# Patient Record
Sex: Female | Born: 1970 | Race: White | Hispanic: No | Marital: Single | Smoking: Current every day smoker
Health system: Southern US, Community
[De-identification: ages and names within clinical notes are randomized; demographics above are authoritative.]

## PROBLEM LIST (undated history)

## (undated) DIAGNOSIS — K219 Gastro-esophageal reflux disease without esophagitis: Secondary | ICD-10-CM

## (undated) DIAGNOSIS — N83209 Unspecified ovarian cyst, unspecified side: Secondary | ICD-10-CM

## (undated) DIAGNOSIS — C801 Malignant (primary) neoplasm, unspecified: Secondary | ICD-10-CM

## (undated) DIAGNOSIS — R51 Headache: Secondary | ICD-10-CM

## (undated) DIAGNOSIS — D649 Anemia, unspecified: Secondary | ICD-10-CM

## (undated) DIAGNOSIS — R0602 Shortness of breath: Secondary | ICD-10-CM

## (undated) DIAGNOSIS — F419 Anxiety disorder, unspecified: Secondary | ICD-10-CM

## (undated) DIAGNOSIS — I499 Cardiac arrhythmia, unspecified: Secondary | ICD-10-CM

## (undated) DIAGNOSIS — M1990 Unspecified osteoarthritis, unspecified site: Secondary | ICD-10-CM

## (undated) DIAGNOSIS — Z720 Tobacco use: Secondary | ICD-10-CM

## (undated) DIAGNOSIS — E079 Disorder of thyroid, unspecified: Secondary | ICD-10-CM

## (undated) DIAGNOSIS — I10 Essential (primary) hypertension: Secondary | ICD-10-CM

## (undated) DIAGNOSIS — N879 Dysplasia of cervix uteri, unspecified: Secondary | ICD-10-CM

## (undated) HISTORY — PX: TOTAL ABDOMINAL HYSTERECTOMY: SHX209

## (undated) HISTORY — DX: Tobacco use: Z72.0

## (undated) HISTORY — PX: ABDOMINAL HYSTERECTOMY: SHX81

## (undated) HISTORY — DX: Unspecified ovarian cyst, unspecified side: N83.209

## (undated) HISTORY — DX: Dysplasia of cervix uteri, unspecified: N87.9

## (undated) HISTORY — PX: UPPER GASTROINTESTINAL ENDOSCOPY: SHX188

## (undated) HISTORY — PX: TONSILLECTOMY: SUR1361

## (undated) HISTORY — PX: OOPHORECTOMY: SHX86

## (undated) HISTORY — DX: Essential (primary) hypertension: I10

---

## 2012-11-10 ENCOUNTER — Ambulatory Visit (INDEPENDENT_AMBULATORY_CARE_PROVIDER_SITE_OTHER): Payer: BC Managed Care – PPO

## 2012-11-10 ENCOUNTER — Encounter: Payer: Self-pay | Admitting: Physician Assistant

## 2012-11-10 ENCOUNTER — Ambulatory Visit (INDEPENDENT_AMBULATORY_CARE_PROVIDER_SITE_OTHER): Payer: BC Managed Care – PPO | Admitting: Physician Assistant

## 2012-11-10 VITALS — BP 116/79 | HR 83 | Wt 164.0 lb

## 2012-11-10 DIAGNOSIS — R200 Anesthesia of skin: Secondary | ICD-10-CM

## 2012-11-10 DIAGNOSIS — M5380 Other specified dorsopathies, site unspecified: Secondary | ICD-10-CM

## 2012-11-10 DIAGNOSIS — R202 Paresthesia of skin: Secondary | ICD-10-CM

## 2012-11-10 DIAGNOSIS — Z13220 Encounter for screening for lipoid disorders: Secondary | ICD-10-CM

## 2012-11-10 DIAGNOSIS — R7989 Other specified abnormal findings of blood chemistry: Secondary | ICD-10-CM

## 2012-11-10 DIAGNOSIS — M79609 Pain in unspecified limb: Secondary | ICD-10-CM

## 2012-11-10 DIAGNOSIS — Z131 Encounter for screening for diabetes mellitus: Secondary | ICD-10-CM

## 2012-11-10 DIAGNOSIS — M4800 Spinal stenosis, site unspecified: Secondary | ICD-10-CM

## 2012-11-10 DIAGNOSIS — M79602 Pain in left arm: Secondary | ICD-10-CM

## 2012-11-10 DIAGNOSIS — Z23 Encounter for immunization: Secondary | ICD-10-CM

## 2012-11-10 DIAGNOSIS — R946 Abnormal results of thyroid function studies: Secondary | ICD-10-CM

## 2012-11-10 DIAGNOSIS — R209 Unspecified disturbances of skin sensation: Secondary | ICD-10-CM

## 2012-11-10 DIAGNOSIS — Z1239 Encounter for other screening for malignant neoplasm of breast: Secondary | ICD-10-CM

## 2012-11-10 DIAGNOSIS — M25519 Pain in unspecified shoulder: Secondary | ICD-10-CM

## 2012-11-10 DIAGNOSIS — M5030 Other cervical disc degeneration, unspecified cervical region: Secondary | ICD-10-CM

## 2012-11-10 MED ORDER — VARENICLINE TARTRATE 0.5 MG X 11 & 1 MG X 42 PO MISC: ORAL | Status: DC

## 2012-11-10 MED ORDER — PREDNISONE 50 MG PO TABS: ORAL_TABLET | ORAL | Status: DC

## 2012-11-10 MED ORDER — OMEPRAZOLE 40 MG PO CPDR: 40.0000 mg | DELAYED_RELEASE_CAPSULE | Freq: Every day | ORAL | Status: DC

## 2012-11-10 MED ORDER — GABAPENTIN 100 MG PO CAPS: 100.0000 mg | ORAL_CAPSULE | Freq: Three times a day (TID) | ORAL | Status: DC

## 2012-11-11 LAB — COMPLETE METABOLIC PANEL WITH GFR
ALT: 13 U/L (ref 0–35)
AST: 17 U/L (ref 0–37)
Alkaline Phosphatase: 65 U/L (ref 39–117)
Calcium: 9.4 mg/dL (ref 8.4–10.5)
Chloride: 104 mEq/L (ref 96–112)
Creat: 0.45 mg/dL — ABNORMAL LOW (ref 0.50–1.10)
Total Bilirubin: 0.4 mg/dL (ref 0.3–1.2)

## 2012-11-11 LAB — LIPID PANEL
LDL Cholesterol: 100 mg/dL — ABNORMAL HIGH (ref 0–99)
Total CHOL/HDL Ratio: 3.4 Ratio
Triglycerides: 103 mg/dL (ref <150)
VLDL: 21 mg/dL (ref 0–40)

## 2012-11-11 LAB — T4, FREE: Free T4: 0.81 ng/dL (ref 0.80–1.80)

## 2012-11-12 ENCOUNTER — Telehealth: Payer: Self-pay | Admitting: *Deleted

## 2012-11-12 ENCOUNTER — Other Ambulatory Visit: Payer: Self-pay | Admitting: Physician Assistant

## 2012-11-12 ENCOUNTER — Encounter: Payer: Self-pay | Admitting: Physician Assistant

## 2012-11-12 DIAGNOSIS — M4802 Spinal stenosis, cervical region: Secondary | ICD-10-CM

## 2012-11-12 DIAGNOSIS — M5030 Other cervical disc degeneration, unspecified cervical region: Secondary | ICD-10-CM | POA: Insufficient documentation

## 2012-11-12 NOTE — Telephone Encounter (Signed)
Pt walks in today stating that the gabapentin is helping some but now she's having extreme arm pain.  She states she can't even pick up her phone.  Please advise

## 2012-11-12 NOTE — Telephone Encounter (Signed)
I can go ahead and order an MRI. Continue taking prednisone/naprosyn and neurontin. If neurontin helping increase to 300mg  three times a day. I do want you to see my partner Dr. Bonnita Levan for further work up.

## 2012-11-12 NOTE — Telephone Encounter (Signed)
Pt notified & is scheduled to see Dr. Karie Schwalbe on Monday.

## 2012-11-12 NOTE — Progress Notes (Signed)
Subjective:    Patient ID: Joanne [name], female    DOB: 1970/12/17, 42 y.o.   MRN: [id number]  HPI Patient is a 42 year old female who presents to the clinic to establish care. She has multiple ongoing complaints. She is currently only on estrogen cream and Ambien. Patient just moved here from [place] and needs a health care provider.  Patient has not had a mammogram nor fasting labs done in over a year. Patient has had a hysterectomy in 2000. Patient's most concerning problem today is her left arm and shoulder pain. Patient denies any trauma to the area. She has significant numbness and tingling that starts at the shoulder and go into fingers. She was seen in the ED and given a muscle relaxer, and anti-inflammatory and narcotics. She feels like none of these are touching the pain. She rates the pain as a 6/10. Per patient these sensation have been going on for over a year. Per patient she is starting to lose grip and left hand due to pain. She's also concerned because her chest started to fill tight. She denies tightness is a relation to exertion. She has also had some experience with indigestion and burning in chest. She's not currently taking anything for acid reflux.  Patient has a history of low TSH levels she has not had her checked in a while. She admits to feeling very fatigued and down.   Patient is a current smoker of one pack a day for 20+ years. She is interested in quitting smoking.   Review of Systems  All other systems reviewed and are negative.       Objective:   Physical Exam  Constitutional: She is oriented to person, place, and time. She appears well-developed and well-nourished.  HENT:  Head: Normocephalic and atraumatic.  Cardiovascular: Normal rate, regular rhythm and normal heart sounds.   Pulmonary/Chest: Effort normal and breath sounds normal.  Musculoskeletal:  Range of motion of left shoulder is normal. Strength of left shoulder is 4/5. No pain to  palpation over the acromion joint or clavicle. Anger normal bilaterally.  Range of motion of neck is normal without pain.  Neurological: She is alert and oriented to person, place, and time.  Skin: Skin is warm and dry.  Psychiatric: She has a normal mood and affect. Her behavior is normal.          Assessment & Plan:  Left arm numbness and tingling/chest tightness- EKG was done due to patient's chest tightness normal sinus rhythm rate at 79, no ST elevation or depression, good R wave progression. Do not the chest tightness is cardiac in origin it could be due to acid reflux or anxiety I anger to give Prilosec 40 mg in the morning for patient. I did discuss with patient that taking Naprosyn regularly could be aggravating her acid reflux that she might want to cut down despite the benefit she might be receiving with her left arm pain numbness and tingling. I do want to get x-rays done of left shoulder and C-spine today. Suspect there might be some herniation or degenerative changes at C-spine level. We'll go ahead and give prednisone for 5 days, continue with naproxen as needed, continue with muscle relaxer and added Neurontin 100 mg up to 3 times a day for nerve pain. After the x-rays we can consider further imaging at that point. Call if worsening of symptoms. Follow up with partner Dr. Bonnita Levan in 2-4 weeks.    Tobacco abuse counseling- patient was started on  Chantix starter pack. Patient is aware of side effects of changes such as bad dreams, worsening depression, hallucinations. If patients experience any of these she will stop medication and call office. Benefits of smoking cessation were reviewed with patient. Followup in one month.  Low TSH level- will recheck today.  tdap given   Refer for mammogram. Fasting labs ordered.

## 2012-11-15 ENCOUNTER — Ambulatory Visit (INDEPENDENT_AMBULATORY_CARE_PROVIDER_SITE_OTHER): Payer: BC Managed Care – PPO | Admitting: Sports Medicine

## 2012-11-15 ENCOUNTER — Telehealth: Payer: Self-pay | Admitting: *Deleted

## 2012-11-15 ENCOUNTER — Encounter: Payer: Self-pay | Admitting: Sports Medicine

## 2012-11-15 VITALS — BP 109/69 | HR 99 | Wt 169.0 lb

## 2012-11-15 DIAGNOSIS — M5030 Other cervical disc degeneration, unspecified cervical region: Secondary | ICD-10-CM

## 2012-11-15 MED ORDER — GABAPENTIN 300 MG PO CAPS: ORAL_CAPSULE | ORAL | Status: DC

## 2012-11-15 MED ORDER — DIAZEPAM 5 MG PO TABS: ORAL_TABLET | ORAL | Status: DC

## 2012-11-15 NOTE — Telephone Encounter (Signed)
Pt calls and states she is very claustrophobic and wants to know if you can give her something to take before has scan done tomorrow at 5:30pm.

## 2012-11-15 NOTE — Telephone Encounter (Signed)
Pt notified and med faxed to pharmacy. Barry Dienes, LPN

## 2012-11-15 NOTE — Assessment & Plan Note (Signed)
Symptoms in her shoulder are likely radicular from the cervical spine, likely C6-C7. 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 I'm going to increase her gabapentin, we're going to add formal physical therapy, and I am going to get an MRI considering her exam findings as well as some lower extremity weakness. Of note I do also suspect that she has lumbar degenerative disc disease.

## 2012-11-15 NOTE — Progress Notes (Deleted)

## 2012-11-15 NOTE — Progress Notes (Signed)
   Subjective:    I'm seeing this patient as a consultation for:  Tandy Gaw, PA  CC: Neck Pain   HPI: Joanne [name] is a pleasant 42 year old female with history of degenerative disc disease who presents with complaint of neck pain. The patient states that her left neck pain began 2 weeks ago as a "tightness" and worsened 3 days ago to a cramping over the lateral aspect of her left arm that stops above the wrist and a sharp/burning pain in her lateral neck. She feels pain just over her left scapula and if she keeps her neck flexed she feels pain over C6. She experiences some relief with 300mg  of neurontin followed an hour later by 1 norco. She denies saddle anesthesia and urinary/bowel incontinence .  Past medical history, Surgical history, Family history not pertinant except as noted below, Social history, Allergies, and medications have been entered into the medical record, reviewed, and no changes needed.   Review of Systems: No headache, visual changes, nausea, vomiting, diarrhea, constipation, dizziness, abdominal pain, skin rash, fevers, chills, night sweats, weight loss, swollen lymph nodes, body aches, joint swelling, muscle aches, chest pain, shortness of breath, mood changes, visual or auditory hallucinations.   Objective:   General: Well Developed, well nourished, and in no acute distress.  Neuro/Psych: Alert and oriented x3, extra-ocular muscles intact, able to move all 4 extremities, sensation grossly intact. Skin: Warm and dry, no rashes noted.  Respiratory: Not using accessory muscles, speaking in full sentences, trachea midline.  Cardiovascular: Pulses palpable, no extremity edema. Abdomen: Does not appear distended. Neck: Positive spurling's on left Full neck range of motion Grip strength and sensation normal in bilateral hands Strength good C4 to T1 distribution No sensory change to C4 to T1 Reflexes brisk throughout Positive Hoffman sign in both upper extremities.    Negative Babinski sign bilaterally.  Impression and Recommendations:   This case required medical decision making of moderate complexity. Degenerative Disc Disease with cervical radiculitis Increase gabapentin  Physical therapy MRI

## 2012-11-15 NOTE — Telephone Encounter (Signed)
Valium is in my box, should be taken 2h before MRI, needs someone to drive her.

## 2012-11-16 ENCOUNTER — Ambulatory Visit (INDEPENDENT_AMBULATORY_CARE_PROVIDER_SITE_OTHER): Payer: BC Managed Care – PPO

## 2012-11-16 ENCOUNTER — Encounter: Payer: Self-pay | Admitting: *Deleted

## 2012-11-16 DIAGNOSIS — M5380 Other specified dorsopathies, site unspecified: Secondary | ICD-10-CM

## 2012-11-16 DIAGNOSIS — M5020 Other cervical disc displacement, unspecified cervical region: Secondary | ICD-10-CM

## 2012-11-16 DIAGNOSIS — M5090 Cervical disc disorder, unspecified, unspecified cervical region: Secondary | ICD-10-CM

## 2012-11-16 DIAGNOSIS — M5030 Other cervical disc degeneration, unspecified cervical region: Secondary | ICD-10-CM

## 2012-11-17 MED ORDER — CYCLOBENZAPRINE HCL 10 MG PO TABS: ORAL_TABLET | ORAL | Status: DC

## 2012-11-18 ENCOUNTER — Ambulatory Visit: Payer: BC Managed Care – PPO

## 2012-11-22 ENCOUNTER — Telehealth: Payer: Self-pay

## 2012-11-24 ENCOUNTER — Encounter: Payer: Self-pay | Admitting: Physician Assistant

## 2012-11-24 DIAGNOSIS — N951 Menopausal and female climacteric states: Secondary | ICD-10-CM | POA: Insufficient documentation

## 2012-11-24 DIAGNOSIS — F329 Major depressive disorder, single episode, unspecified: Secondary | ICD-10-CM | POA: Insufficient documentation

## 2012-11-24 DIAGNOSIS — G4700 Insomnia, unspecified: Secondary | ICD-10-CM | POA: Insufficient documentation

## 2012-11-24 DIAGNOSIS — N879 Dysplasia of cervix uteri, unspecified: Secondary | ICD-10-CM | POA: Insufficient documentation

## 2012-11-25 ENCOUNTER — Ambulatory Visit (INDEPENDENT_AMBULATORY_CARE_PROVIDER_SITE_OTHER): Payer: BC Managed Care – PPO

## 2012-11-25 DIAGNOSIS — R928 Other abnormal and inconclusive findings on diagnostic imaging of breast: Secondary | ICD-10-CM

## 2012-11-25 DIAGNOSIS — Z1239 Encounter for other screening for malignant neoplasm of breast: Secondary | ICD-10-CM

## 2012-11-25 DIAGNOSIS — Z1231 Encounter for screening mammogram for malignant neoplasm of breast: Secondary | ICD-10-CM

## 2012-11-29 ENCOUNTER — Other Ambulatory Visit: Payer: Self-pay | Admitting: Physician Assistant

## 2012-11-29 DIAGNOSIS — R928 Other abnormal and inconclusive findings on diagnostic imaging of breast: Secondary | ICD-10-CM

## 2012-12-02 ENCOUNTER — Ambulatory Visit: Payer: BC Managed Care – PPO | Admitting: Physical Therapy

## 2012-12-09 ENCOUNTER — Encounter: Payer: Self-pay | Admitting: Sports Medicine

## 2012-12-09 ENCOUNTER — Ambulatory Visit: Payer: BC Managed Care – PPO | Admitting: Physical Therapy

## 2012-12-09 ENCOUNTER — Ambulatory Visit (INDEPENDENT_AMBULATORY_CARE_PROVIDER_SITE_OTHER): Payer: BC Managed Care – PPO | Admitting: Sports Medicine

## 2012-12-09 VITALS — BP 128/78 | HR 76 | Wt 165.0 lb

## 2012-12-09 DIAGNOSIS — I8310 Varicose veins of unspecified lower extremity with inflammation: Secondary | ICD-10-CM

## 2012-12-09 DIAGNOSIS — S6390XA Sprain of unspecified part of unspecified wrist and hand, initial encounter: Secondary | ICD-10-CM

## 2012-12-09 DIAGNOSIS — I872 Venous insufficiency (chronic) (peripheral): Secondary | ICD-10-CM | POA: Insufficient documentation

## 2012-12-09 DIAGNOSIS — M5030 Other cervical disc degeneration, unspecified cervical region: Secondary | ICD-10-CM

## 2012-12-09 DIAGNOSIS — S63619A Unspecified sprain of unspecified finger, initial encounter: Secondary | ICD-10-CM | POA: Insufficient documentation

## 2012-12-09 MED ORDER — PREDNISONE 10 MG PO TABS: ORAL_TABLET | ORAL | Status: DC

## 2012-12-09 MED ORDER — AMBULATORY NON FORMULARY MEDICATION: Status: DC

## 2012-12-09 NOTE — Progress Notes (Addendum)
  Subjective:    CC: Rash on legs  HPI: This is a very pleasant 42 year old female who wants to discuss several issues.  Rash on legs: Has been present on and off for a long time, when she spends a lot of time on her feet, she notes swelling of her legs, and development of this rash. It is localized to the medial aspect of both lower legs, is not painful but does itch a little bit, and resolves over several months.  Left hand and finger sprain: Occurred 2 days ago, swollen over the left second proximal interphalangeal joint, wondering what she can do about this.  Cervical degenerative disc disease: MRI from the recent past showed severe herniated disc with spinal cord compression, on questioning at the last visit she did have a change in her gait as well as weakness in her lower extremities without any bowel or bladder dysfunction. She did have a positive Hoffman sign at that time. I referred her to Dr. [facility] but unfortunately she has not yet received a call back. She did get a little bit better with the prednisone, but her lower extremity weakness continues.  Past medical history, Surgical history, Family history not pertinant except as noted below, Social history, Allergies, and medications have been entered into the medical record, reviewed, and no changes needed.   Review of Systems: No fevers, chills, night sweats, weight loss, chest pain, or shortness of breath.   Objective:    General: Well Developed, well nourished, and in no acute distress.  Neuro: Alert and oriented x3, extra-ocular muscles intact, sensation grossly intact.  HEENT: Normocephalic, atraumatic, pupils equal round reactive to light, neck supple, no masses, no lymphadenopathy, thyroid nonpalpable.  Skin: Warm and dry, no rashes. Cardiac: Regular rate and rhythm, no murmurs rubs or gallops, no lower extremity edema.  Respiratory: Clear to auscultation bilaterally. Not using accessory muscles,  speaking in full sentences. Left hand: There is a fusiform swelling with tenderness to palpation over the second proximal interphalangeal joint.  Fingers were strapped. Lower extremities: There is venous stasis dermatitis with hemosiderin deposition on the medial aspect of both lower legs. This is nontender to palpation.  Impression and Recommendations:

## 2012-12-09 NOTE — Assessment & Plan Note (Signed)
Knee-high compression stockings. These should be worn daily, return as needed for this.

## 2012-12-09 NOTE — Assessment & Plan Note (Signed)
X-rays, Buddy taping. Return if no better in 2 weeks.

## 2012-12-09 NOTE — Assessment & Plan Note (Signed)
MRI shows severe cervical cord compression, she is having some lower extremity weakness, she never got a call back from [facility] regarding referral to Dr. Yevette Edwards. I'm going to place this referral, I'm also going to prednisone taper.

## 2012-12-10 ENCOUNTER — Ambulatory Visit: Payer: BC Managed Care – PPO | Admitting: Sports Medicine

## 2012-12-14 ENCOUNTER — Encounter: Payer: BC Managed Care – PPO | Admitting: Physical Therapy

## 2012-12-16 ENCOUNTER — Encounter: Payer: BC Managed Care – PPO | Admitting: Physical Therapy

## 2012-12-16 DIAGNOSIS — M546 Pain in thoracic spine: Secondary | ICD-10-CM

## 2012-12-16 DIAGNOSIS — M25619 Stiffness of unspecified shoulder, not elsewhere classified: Secondary | ICD-10-CM

## 2012-12-16 DIAGNOSIS — M542 Cervicalgia: Secondary | ICD-10-CM

## 2012-12-16 DIAGNOSIS — M5030 Other cervical disc degeneration, unspecified cervical region: Secondary | ICD-10-CM

## 2012-12-17 ENCOUNTER — Ambulatory Visit
Admission: RE | Admit: 2012-12-17 | Discharge: 2012-12-17 | Disposition: A | Discharge status: Home / Self Care | Payer: BC Managed Care – PPO | Source: Ambulatory Visit | Attending: Physician Assistant | Admitting: Physician Assistant

## 2012-12-17 DIAGNOSIS — R928 Other abnormal and inconclusive findings on diagnostic imaging of breast: Secondary | ICD-10-CM

## 2012-12-22 ENCOUNTER — Other Ambulatory Visit: Payer: Self-pay | Admitting: Orthopedic Surgery

## 2012-12-23 ENCOUNTER — Encounter: Payer: BC Managed Care – PPO | Admitting: Physical Therapy

## 2012-12-29 ENCOUNTER — Encounter: Payer: BC Managed Care – PPO | Admitting: Physical Therapy

## 2012-12-30 ENCOUNTER — Encounter (HOSPITAL_COMMUNITY): Payer: Self-pay | Admitting: Pharmacy Technician

## 2013-01-01 NOTE — Pre-Procedure Instructions (Signed)
ARRIELLE MCGINN  01/01/2013   Your procedure is scheduled on:  October 2  Report to [facility] Entrance "A" [address] at 08:00 AM.  Call this number if you have problems the morning of surgery: [phone]   Remember:   Do not eat food or drink liquids after midnight.   Take these medicines the morning of surgery with A SIP OF WATER: Gabapentin, Omeprazole, Prednisone (if still on),    STOP Naproxen today  Do not take Aspirin, Aleve, Naproxen, Advil, Ibuprofen, Vitamin, Herbs, or Supplements starting today  Do not wear jewelry, make-up or nail polish.  Do not wear lotions, powders, or perfumes. You may wear deodorant.  Do not shave 48 hours prior to surgery. Men may shave face and neck.  Do not bring valuables to the hospital.  [facility] is not responsible                  for any belongings or valuables.               Contacts, dentures or bridgework may not be worn into surgery.  Leave suitcase in the car. After surgery it may be brought to your room.  For patients admitted to the hospital, discharge time is determined by your                treatment team.               Special Instructions: Shower using CHG 2 nights before surgery and the night before surgery.  If you shower the day of surgery use CHG.  Use special wash - you have one bottle of CHG for all showers.  You should use approximately 1/3 of the bottle for each shower.   Please read over the following fact sheets that you were given: Pain Booklet, Coughing and Deep Breathing, Blood Transfusion Information and Surgical Site Infection Prevention

## 2013-01-03 ENCOUNTER — Encounter (HOSPITAL_COMMUNITY): Payer: Self-pay

## 2013-01-03 ENCOUNTER — Encounter (HOSPITAL_COMMUNITY)
Admission: RE | Admit: 2013-01-03 | Discharge: 2013-01-03 | Disposition: A | Discharge status: Home / Self Care | Payer: BC Managed Care – PPO | Source: Ambulatory Visit | Attending: Orthopedic Surgery | Admitting: Orthopedic Surgery

## 2013-01-03 HISTORY — DX: Anxiety disorder, unspecified: F41.9

## 2013-01-03 HISTORY — DX: Unspecified osteoarthritis, unspecified site: M19.90

## 2013-01-03 HISTORY — DX: Cardiac arrhythmia, unspecified: I49.9

## 2013-01-03 HISTORY — DX: Headache: R51

## 2013-01-03 HISTORY — DX: Gastro-esophageal reflux disease without esophagitis: K21.9

## 2013-01-03 HISTORY — DX: Shortness of breath: R06.02

## 2013-01-03 HISTORY — DX: Malignant (primary) neoplasm, unspecified: C80.1

## 2013-01-03 HISTORY — DX: Anemia, unspecified: D64.9

## 2013-01-03 LAB — CBC WITH DIFFERENTIAL/PLATELET
Basophils Absolute: 0 10*3/uL (ref 0.0–0.1)
Basophils Relative: 0 % (ref 0–1)
HCT: 39.3 % (ref 36.0–46.0)
Hemoglobin: 13.7 g/dL (ref 12.0–15.0)
Lymphocytes Relative: 23 % (ref 12–46)
Monocytes Absolute: 0.7 10*3/uL (ref 0.1–1.0)
Monocytes Relative: 7 % (ref 3–12)
Neutro Abs: 7.7 10*3/uL (ref 1.7–7.7)
Neutrophils Relative %: 69 % (ref 43–77)
WBC: 11.2 10*3/uL — ABNORMAL HIGH (ref 4.0–10.5)

## 2013-01-03 LAB — COMPREHENSIVE METABOLIC PANEL
AST: 13 U/L (ref 0–37)
Albumin: 4.1 g/dL (ref 3.5–5.2)
Alkaline Phosphatase: 76 U/L (ref 39–117)
BUN: 10 mg/dL (ref 6–23)
CO2: 28 mEq/L (ref 19–32)
Chloride: 101 mEq/L (ref 96–112)
Creatinine, Ser: 0.53 mg/dL (ref 0.50–1.10)
GFR calc non Af Amer: >90 mL/min (ref >90)
Potassium: 3.4 mEq/L — ABNORMAL LOW (ref 3.5–5.1)
Total Bilirubin: 0.2 mg/dL — ABNORMAL LOW (ref 0.3–1.2)
Total Protein: 7.5 g/dL (ref 6.0–8.3)

## 2013-01-03 LAB — URINALYSIS, ROUTINE W REFLEX MICROSCOPIC
Glucose, UA: NEGATIVE mg/dL
Ketones, ur: 15 mg/dL — AB
Nitrite: NEGATIVE
Specific Gravity, Urine: 1.029 (ref 1.005–1.030)
pH: 5.5 (ref 5.0–8.0)

## 2013-01-03 LAB — SURGICAL PCR SCREEN
MRSA, PCR: NEGATIVE
Staphylococcus aureus: NEGATIVE

## 2013-01-03 LAB — PROTIME-INR: INR: 0.91 (ref 0.00–1.49)

## 2013-01-03 LAB — APTT: aPTT: 33 seconds (ref 24–37)

## 2013-01-03 LAB — URINE MICROSCOPIC-ADD ON

## 2013-01-03 LAB — TYPE AND SCREEN

## 2013-01-05 MED ORDER — CEFAZOLIN SODIUM-DEXTROSE 2-3 GM-% IV SOLR
2.0000 g | INTRAVENOUS | Status: AC
  Administered 2013-01-06: 2 g via INTRAVENOUS
  Filled 2013-01-05 (×2): qty 50

## 2013-01-06 ENCOUNTER — Ambulatory Visit (HOSPITAL_COMMUNITY): Payer: BC Managed Care – PPO

## 2013-01-06 ENCOUNTER — Encounter (HOSPITAL_COMMUNITY): Payer: Self-pay | Admitting: Anesthesiology

## 2013-01-06 ENCOUNTER — Encounter (HOSPITAL_COMMUNITY)
Admission: RE | Disposition: A | Discharge status: Home / Self Care | Payer: Self-pay | Source: Ambulatory Visit | Attending: Orthopedic Surgery

## 2013-01-06 ENCOUNTER — Ambulatory Visit (HOSPITAL_COMMUNITY): Payer: BC Managed Care – PPO | Admitting: Anesthesiology

## 2013-01-06 ENCOUNTER — Inpatient Hospital Stay (HOSPITAL_COMMUNITY)
Admission: RE | Admit: 2013-01-06 | Discharge: 2013-01-07 | DRG: 865 | Disposition: A | Discharge status: Home / Self Care | Payer: BC Managed Care – PPO | Source: Ambulatory Visit | Attending: Orthopedic Surgery | Admitting: Orthopedic Surgery

## 2013-01-06 DIAGNOSIS — Z833 Family history of diabetes mellitus: Secondary | ICD-10-CM

## 2013-01-06 DIAGNOSIS — Z9851 Tubal ligation status: Secondary | ICD-10-CM

## 2013-01-06 DIAGNOSIS — F17200 Nicotine dependence, unspecified, uncomplicated: Secondary | ICD-10-CM | POA: Diagnosis present

## 2013-01-06 DIAGNOSIS — Z01812 Encounter for preprocedural laboratory examination: Secondary | ICD-10-CM

## 2013-01-06 DIAGNOSIS — F411 Generalized anxiety disorder: Secondary | ICD-10-CM | POA: Diagnosis present

## 2013-01-06 DIAGNOSIS — Z818 Family history of other mental and behavioral disorders: Secondary | ICD-10-CM

## 2013-01-06 DIAGNOSIS — K219 Gastro-esophageal reflux disease without esophagitis: Secondary | ICD-10-CM | POA: Diagnosis present

## 2013-01-06 DIAGNOSIS — Z8249 Family history of ischemic heart disease and other diseases of the circulatory system: Secondary | ICD-10-CM

## 2013-01-06 DIAGNOSIS — F40298 Other specified phobia: Secondary | ICD-10-CM | POA: Diagnosis present

## 2013-01-06 DIAGNOSIS — M5000 Cervical disc disorder with myelopathy, unspecified cervical region: Principal | ICD-10-CM | POA: Diagnosis present

## 2013-01-06 DIAGNOSIS — Z6379 Other stressful life events affecting family and household: Secondary | ICD-10-CM

## 2013-01-06 HISTORY — PX: ANTERIOR CERVICAL DECOMP/DISCECTOMY FUSION: SHX1161

## 2013-01-06 LAB — URINE MICROSCOPIC-ADD ON

## 2013-01-06 LAB — URINALYSIS, ROUTINE W REFLEX MICROSCOPIC
Glucose, UA: NEGATIVE mg/dL
Ketones, ur: 15 mg/dL — AB
Specific Gravity, Urine: 1.022 (ref 1.005–1.030)
pH: 5.5 (ref 5.0–8.0)

## 2013-01-06 SURGERY — ANTERIOR CERVICAL DECOMPRESSION/DISCECTOMY FUSION 2 LEVELS
Anesthesia: General | Site: Neck | Wound class: Clean

## 2013-01-06 MED ORDER — SODIUM CHLORIDE 0.9 % IJ SOLN: 3.0000 mL | INTRAMUSCULAR | Status: DC | PRN

## 2013-01-06 MED ORDER — ONDANSETRON HCL 4 MG/2ML IJ SOLN: 4.0000 mg | INTRAMUSCULAR | Status: DC | PRN

## 2013-01-06 MED ORDER — MENTHOL 3 MG MT LOZG: 1.0000 | LOZENGE | OROMUCOSAL | Status: DC | PRN

## 2013-01-06 MED ORDER — THROMBIN 20000 UNITS EX KIT
PACK | CUTANEOUS | Status: DC | PRN
  Administered 2013-01-06: 13:00:00 via TOPICAL

## 2013-01-06 MED ORDER — HYDROMORPHONE HCL PF 1 MG/ML IJ SOLN
INTRAMUSCULAR | Status: AC
  Filled 2013-01-06: qty 1

## 2013-01-06 MED ORDER — SODIUM CHLORIDE 0.9 % IV SOLN: 250.0000 mL | INTRAVENOUS | Status: DC

## 2013-01-06 MED ORDER — HYDROMORPHONE HCL PF 1 MG/ML IJ SOLN
0.2500 mg | INTRAMUSCULAR | Status: DC | PRN
  Administered 2013-01-06 (×3): 0.5 mg via INTRAVENOUS

## 2013-01-06 MED ORDER — ACETAMINOPHEN 650 MG RE SUPP: 650.0000 mg | RECTAL | Status: DC | PRN

## 2013-01-06 MED ORDER — PROPOFOL 10 MG/ML IV EMUL
INTRAVENOUS | Status: DC | PRN
  Administered 2013-01-06: 140 ug/kg/min via INTRAVENOUS

## 2013-01-06 MED ORDER — DIAZEPAM 5 MG PO TABS
5.0000 mg | ORAL_TABLET | Freq: Four times a day (QID) | ORAL | Status: DC | PRN
Start: 2013-01-06 — End: 2013-01-07

## 2013-01-06 MED ORDER — ONDANSETRON HCL 4 MG/2ML IJ SOLN
INTRAMUSCULAR | Status: DC | PRN
  Administered 2013-01-06: 4 mg via INTRAVENOUS

## 2013-01-06 MED ORDER — FLEET ENEMA 7-19 GM/118ML RE ENEM: 1.0000 | ENEMA | Freq: Once | RECTAL | Status: AC | PRN

## 2013-01-06 MED ORDER — DEXAMETHASONE SODIUM PHOSPHATE 4 MG/ML IJ SOLN
INTRAMUSCULAR | Status: DC | PRN
  Administered 2013-01-06: 10 mg via INTRAVENOUS

## 2013-01-06 MED ORDER — MIDAZOLAM HCL 5 MG/5ML IJ SOLN
INTRAMUSCULAR | Status: DC | PRN
  Administered 2013-01-06: 2 mg via INTRAVENOUS

## 2013-01-06 MED ORDER — THROMBIN 20000 UNITS EX SOLR
CUTANEOUS | Status: AC
  Filled 2013-01-06: qty 20000

## 2013-01-06 MED ORDER — CEFAZOLIN SODIUM 1-5 GM-% IV SOLN
1.0000 g | Freq: Three times a day (TID) | INTRAVENOUS | Status: AC
  Administered 2013-01-06 – 2013-01-07 (×2): 1 g via INTRAVENOUS
  Filled 2013-01-06 (×2): qty 50

## 2013-01-06 MED ORDER — ZOLPIDEM TARTRATE 5 MG PO TABS: 5.0000 mg | ORAL_TABLET | Freq: Every evening | ORAL | Status: DC | PRN

## 2013-01-06 MED ORDER — FENTANYL CITRATE 0.05 MG/ML IJ SOLN
INTRAMUSCULAR | Status: DC | PRN
  Administered 2013-01-06 (×3): 50 ug via INTRAVENOUS
  Administered 2013-01-06: 100 ug via INTRAVENOUS

## 2013-01-06 MED ORDER — SUCCINYLCHOLINE CHLORIDE 20 MG/ML IJ SOLN
INTRAMUSCULAR | Status: DC | PRN
  Administered 2013-01-06: 100 mg via INTRAVENOUS

## 2013-01-06 MED ORDER — LIDOCAINE HCL (CARDIAC) 20 MG/ML IV SOLN
INTRAVENOUS | Status: DC | PRN
  Administered 2013-01-06: 80 mg via INTRAVENOUS

## 2013-01-06 MED ORDER — 0.9 % SODIUM CHLORIDE (POUR BTL) OPTIME
TOPICAL | Status: DC | PRN
  Administered 2013-01-06: 1000 mL

## 2013-01-06 MED ORDER — OXYCODONE HCL 5 MG/5ML PO SOLN: 5.0000 mg | Freq: Once | ORAL | Status: DC | PRN

## 2013-01-06 MED ORDER — ALUM & MAG HYDROXIDE-SIMETH 200-200-20 MG/5ML PO SUSP: 30.0000 mL | Freq: Four times a day (QID) | ORAL | Status: DC | PRN

## 2013-01-06 MED ORDER — LEVOFLOXACIN IN D5W 500 MG/100ML IV SOLN
500.0000 mg | Freq: Once | INTRAVENOUS | Status: AC
  Administered 2013-01-06: 500 mg via INTRAVENOUS
  Filled 2013-01-06: qty 100

## 2013-01-06 MED ORDER — SODIUM CHLORIDE 0.9 % IJ SOLN: 3.0000 mL | Freq: Two times a day (BID) | INTRAMUSCULAR | Status: DC

## 2013-01-06 MED ORDER — GABAPENTIN 300 MG PO CAPS
300.0000 mg | ORAL_CAPSULE | Freq: Three times a day (TID) | ORAL | Status: DC | PRN
  Filled 2013-01-06: qty 1

## 2013-01-06 MED ORDER — DOCUSATE SODIUM 100 MG PO CAPS
100.0000 mg | ORAL_CAPSULE | Freq: Two times a day (BID) | ORAL | Status: DC
  Filled 2013-01-06 (×2): qty 1

## 2013-01-06 MED ORDER — OXYCODONE-ACETAMINOPHEN 5-325 MG PO TABS
1.0000 | ORAL_TABLET | ORAL | Status: DC | PRN
  Administered 2013-01-07: 2 via ORAL
  Filled 2013-01-06 (×2): qty 2

## 2013-01-06 MED ORDER — ACETAMINOPHEN 325 MG PO TABS: 650.0000 mg | ORAL_TABLET | ORAL | Status: DC | PRN

## 2013-01-06 MED ORDER — ESTRADIOL 0.1 MG/GM VA CREA: 1.0000 g | TOPICAL_CREAM | Freq: Every day | VAGINAL | Status: DC

## 2013-01-06 MED ORDER — PANTOPRAZOLE SODIUM 40 MG PO TBEC
80.0000 mg | DELAYED_RELEASE_TABLET | Freq: Every day | ORAL | Status: DC
  Administered 2013-01-07: 80 mg via ORAL
  Filled 2013-01-06 (×2): qty 2

## 2013-01-06 MED ORDER — PROMETHAZINE HCL 25 MG/ML IJ SOLN: 6.2500 mg | INTRAMUSCULAR | Status: DC | PRN

## 2013-01-06 MED ORDER — BUPIVACAINE-EPINEPHRINE PF 0.25-1:200000 % IJ SOLN
INTRAMUSCULAR | Status: AC
  Filled 2013-01-06: qty 30

## 2013-01-06 MED ORDER — BUPIVACAINE-EPINEPHRINE 0.25% -1:200000 IJ SOLN
INTRAMUSCULAR | Status: DC | PRN
  Administered 2013-01-06: 30 mL

## 2013-01-06 MED ORDER — PHENOL 1.4 % MT LIQD: 1.0000 | OROMUCOSAL | Status: DC | PRN

## 2013-01-06 MED ORDER — HYDROMORPHONE HCL PF 1 MG/ML IJ SOLN
0.5000 mg | INTRAMUSCULAR | Status: DC | PRN
  Administered 2013-01-06 (×2): 1 mg via INTRAVENOUS
  Administered 2013-01-07: 0.5 mg via INTRAVENOUS
  Administered 2013-01-07: 1 mg via INTRAVENOUS
  Administered 2013-01-07: 0.5 mg via INTRAVENOUS
  Filled 2013-01-06 (×5): qty 1

## 2013-01-06 MED ORDER — ARTIFICIAL TEARS OP OINT
TOPICAL_OINTMENT | OPHTHALMIC | Status: DC | PRN
  Administered 2013-01-06: 1 via OPHTHALMIC

## 2013-01-06 MED ORDER — LACTATED RINGERS IV SOLN
INTRAVENOUS | Status: DC | PRN
  Administered 2013-01-06 (×2): via INTRAVENOUS

## 2013-01-06 MED ORDER — OXYCODONE HCL 5 MG PO TABS: 5.0000 mg | ORAL_TABLET | Freq: Once | ORAL | Status: DC | PRN

## 2013-01-06 MED ORDER — PHENYLEPHRINE HCL 10 MG/ML IJ SOLN
10.0000 mg | INTRAVENOUS | Status: DC | PRN
  Administered 2013-01-06: 25 ug/min via INTRAVENOUS

## 2013-01-06 MED ORDER — SENNOSIDES-DOCUSATE SODIUM 8.6-50 MG PO TABS: 1.0000 | ORAL_TABLET | Freq: Every evening | ORAL | Status: DC | PRN

## 2013-01-06 MED ORDER — CEFAZOLIN SODIUM 1-5 GM-% IV SOLN
INTRAVENOUS | Status: AC
  Filled 2013-01-06: qty 50

## 2013-01-06 MED ORDER — POVIDONE-IODINE 7.5 % EX SOLN: Freq: Once | CUTANEOUS | Status: DC

## 2013-01-06 MED ORDER — LEVOFLOXACIN IN D5W 500 MG/100ML IV SOLN
INTRAVENOUS | Status: DC | PRN
  Administered 2013-01-06: 500 mg via INTRAVENOUS

## 2013-01-06 MED ORDER — LACTATED RINGERS IV SOLN
INTRAVENOUS | Status: DC
  Administered 2013-01-06: 09:00:00 via INTRAVENOUS

## 2013-01-06 MED ORDER — BISACODYL 5 MG PO TBEC: 5.0000 mg | DELAYED_RELEASE_TABLET | Freq: Every day | ORAL | Status: DC | PRN

## 2013-01-06 MED ORDER — PROPOFOL 10 MG/ML IV BOLUS
INTRAVENOUS | Status: DC | PRN
  Administered 2013-01-06: 40 mg via INTRAVENOUS
  Administered 2013-01-06: 160 mg via INTRAVENOUS

## 2013-01-06 SURGICAL SUPPLY — 74 items
BENZOIN TINCTURE PRP APPL 2/3 (GAUZE/BANDAGES/DRESSINGS) ×2 IMPLANT
BIT DRILL NEURO 2X3.1 SFT TUCH (MISCELLANEOUS) ×1 IMPLANT
BIT DRILL SKYLINE 12MM (BIT) ×1 IMPLANT
BLADE SURG 15 STRL LF DISP TIS (BLADE) ×1 IMPLANT
BLADE SURG 15 STRL SS (BLADE) ×1
BLADE SURG ROTATE 9660 (MISCELLANEOUS) IMPLANT
BUR MATCHSTICK NEURO 3.0 LAGG (BURR) IMPLANT
CARTRIDGE OIL MAESTRO DRILL (MISCELLANEOUS) ×1 IMPLANT
CLOTH BEACON ORANGE TIMEOUT ST (SAFETY) ×2 IMPLANT
CLSR STERI-STRIP ANTIMIC 1/2X4 (GAUZE/BANDAGES/DRESSINGS) ×2 IMPLANT
COLLAR CERV LO CONTOUR FIRM DE (SOFTGOODS) IMPLANT
CORDS BIPOLAR (ELECTRODE) ×2 IMPLANT
COVER SURGICAL LIGHT HANDLE (MISCELLANEOUS) ×2 IMPLANT
CRADLE DONUT ADULT HEAD (MISCELLANEOUS) ×2 IMPLANT
DEVICE ENDSKLTN CRVCL 5MM-0SM (Orthopedic Implant) ×1 IMPLANT
DIFFUSER DRILL AIR PNEUMATIC (MISCELLANEOUS) ×2 IMPLANT
DRAIN JACKSON RD 7FR 3/32 (WOUND CARE) IMPLANT
DRAPE C-ARM 42X72 X-RAY (DRAPES) ×2 IMPLANT
DRAPE POUCH INSTRU U-SHP 10X18 (DRAPES) ×2 IMPLANT
DRAPE SURG 17X23 STRL (DRAPES) ×8 IMPLANT
DRILL BIT SKYLINE 12MM (BIT) ×1
DRILL NEURO 2X3.1 SOFT TOUCH (MISCELLANEOUS) ×2
DURAPREP 26ML APPLICATOR (WOUND CARE) ×2 IMPLANT
ELECT COATED BLADE 2.86 ST (ELECTRODE) ×2 IMPLANT
ELECT REM PT RETURN 9FT ADLT (ELECTROSURGICAL) ×2
ELECTRODE REM PT RTRN 9FT ADLT (ELECTROSURGICAL) ×1 IMPLANT
ENDOSKELETON CERVICAL 5MM-0SM (Orthopedic Implant) ×2 IMPLANT
EVACUATOR SILICONE 100CC (DRAIN) IMPLANT
GAUZE SPONGE 4X4 16PLY XRAY LF (GAUZE/BANDAGES/DRESSINGS) ×2 IMPLANT
GLOVE BIO SURGEON STRL SZ7 (GLOVE) ×2 IMPLANT
GLOVE BIO SURGEON STRL SZ8 (GLOVE) ×2 IMPLANT
GLOVE BIOGEL PI IND STRL 7.5 (GLOVE) ×2 IMPLANT
GLOVE BIOGEL PI IND STRL 8 (GLOVE) ×1 IMPLANT
GLOVE BIOGEL PI INDICATOR 7.5 (GLOVE) ×2
GLOVE BIOGEL PI INDICATOR 8 (GLOVE) ×1
GOWN STRL NON-REIN LRG LVL3 (GOWN DISPOSABLE) ×4 IMPLANT
GOWN STRL REIN XL XLG (GOWN DISPOSABLE) ×4 IMPLANT
INTERLOCK LRDTC CRVCL VBR SM (Bone Implant) ×1 IMPLANT
IV CATH 14GX2 1/4 (CATHETERS) ×2 IMPLANT
KIT BASIN OR (CUSTOM PROCEDURE TRAY) ×2 IMPLANT
KIT ROOM TURNOVER OR (KITS) ×2 IMPLANT
LORDOTIC CERVICAL VBR SM 5MM (Bone Implant) ×2 IMPLANT
MANIFOLD NEPTUNE II (INSTRUMENTS) IMPLANT
NEEDLE 27GAX1X1/2 (NEEDLE) ×2 IMPLANT
NEEDLE SPNL 20GX3.5 QUINCKE YW (NEEDLE) ×2 IMPLANT
NEURO MONITORING STIM (LABOR (TRAVEL & OVERTIME)) ×2 IMPLANT
NS IRRIG 1000ML POUR BTL (IV SOLUTION) ×2 IMPLANT
OIL CARTRIDGE MAESTRO DRILL (MISCELLANEOUS) ×2
PACK ORTHO CERVICAL (CUSTOM PROCEDURE TRAY) ×2 IMPLANT
PAD ARMBOARD 7.5X6 YLW CONV (MISCELLANEOUS) ×4 IMPLANT
PATTIES SURGICAL .5 X.5 (GAUZE/BANDAGES/DRESSINGS) ×2 IMPLANT
PATTIES SURGICAL .5 X1 (DISPOSABLE) IMPLANT
PIN DISTRACTION 12MM (PIN) ×2
PIN DSTRCT 12XNS SS ACIS (PIN) ×2 IMPLANT
PLATE SKYLINE 2LVL 26MM (Plate) ×2 IMPLANT
SCREW SKYLINE VARIABLE LG (Screw) ×2 IMPLANT
SPONGE GAUZE 4X4 12PLY (GAUZE/BANDAGES/DRESSINGS) ×2 IMPLANT
SPONGE INTESTINAL PEANUT (DISPOSABLE) ×2 IMPLANT
SPONGE SURGIFOAM ABS GEL 100 (HEMOSTASIS) ×2 IMPLANT
STRIP CLOSURE SKIN 1/2X4 (GAUZE/BANDAGES/DRESSINGS) ×2 IMPLANT
SURGIFLO TRUKIT (HEMOSTASIS) IMPLANT
SUT MNCRL AB 4-0 PS2 18 (SUTURE) ×2 IMPLANT
SUT SILK 4 0 (SUTURE) ×1
SUT SILK 4-0 18XBRD TIE 12 (SUTURE) ×1 IMPLANT
SUT VIC AB 2-0 CT2 18 VCP726D (SUTURE) ×2 IMPLANT
SYR BULB IRRIGATION 50ML (SYRINGE) ×2 IMPLANT
SYR CONTROL 10ML LL (SYRINGE) ×4 IMPLANT
TAPE CLOTH 4X10 WHT NS (GAUZE/BANDAGES/DRESSINGS) ×2 IMPLANT
TAPE CLOTH SURG 4X10 WHT LF (GAUZE/BANDAGES/DRESSINGS) ×2 IMPLANT
TAPE UMBILICAL COTTON 1/8X30 (MISCELLANEOUS) ×2 IMPLANT
TOWEL OR 17X24 6PK STRL BLUE (TOWEL DISPOSABLE) ×2 IMPLANT
TOWEL OR 17X26 10 PK STRL BLUE (TOWEL DISPOSABLE) ×2 IMPLANT
WATER STERILE IRR 1000ML POUR (IV SOLUTION) ×2 IMPLANT
YANKAUER SUCT BULB TIP NO VENT (SUCTIONS) ×2 IMPLANT

## 2013-01-06 NOTE — Transfer of Care (Signed)
Immediate Anesthesia Transfer of Care Note  Patient: Court Joy  Procedure(s) Performed: Procedure(s) with comments: ANTERIOR CERVICAL DECOMPRESSION/DISCECTOMY FUSION 2 LEVELS (N/A) - Anterior cervical decompression fusion, cervical 5-6, cervical 6-7 with instrumentation and allograft  Patient Location: PACU  Anesthesia Type:General  Level of Consciousness: awake, alert , oriented and patient cooperative  Airway & Oxygen Therapy: Patient Spontanous Breathing and Patient connected to nasal cannula oxygen  Post-op Assessment: Report given to PACU RN and Post -op Vital signs reviewed and stable  Post vital signs: Reviewed and stable  Complications: No apparent anesthesia complications

## 2013-01-06 NOTE — Anesthesia Postprocedure Evaluation (Signed)
  Anesthesia Post-op Note  Patient: Joanne [name]  Procedure(s) Performed: Procedure(s) with comments: ANTERIOR CERVICAL DECOMPRESSION/DISCECTOMY FUSION 2 LEVELS (N/A) - Anterior cervical decompression fusion, cervical 5-6, cervical 6-7 with instrumentation and allograft  Patient Location: PACU  Anesthesia Type:General  Level of Consciousness: awake and alert   Airway and Oxygen Therapy: Patient Spontanous Breathing  Post-op Pain: mild  Post-op Assessment: Post-op Vital signs reviewed  Post-op Vital Signs: stable  Complications: No apparent anesthesia complications

## 2013-01-06 NOTE — Anesthesia Preprocedure Evaluation (Addendum)
Anesthesia Evaluation  Patient identified by MRN, date of birth, ID band Patient awake    Reviewed: Allergy & Precautions, H&P , NPO status   Airway Mallampati: II TM Distance: >3 FB Neck ROM: Limited  Mouth opening: Limited Mouth Opening  Dental  (+) Dental Advisory Given   Pulmonary shortness of breath and with exertion,  9-FINDINGS: Normal heart size, mediastinal contours, and pulmonary vascularity.   Lungs clear.   No pleural effusion or pneumothorax.   Bones unremarkable.   IMPRESSION: No acute abnormalities.  29-14 Chest  breath sounds clear to auscultation  Pulmonary exam normal       Cardiovascular negative cardio ROS  Rhythm:Regular Rate:Normal  11-10-12 EKG NSR Rate 79/min   Neuro/Psych  Headaches, Anxiety Depression C5-6, C6-7 Disc rupture with some cord compression    GI/Hepatic Neg liver ROS, GERD-  Medicated and Controlled,  Endo/Other  negative endocrine ROS  Renal/GU negative Renal ROS     Musculoskeletal  (+) Arthritis -, Osteoarthritis,    Abdominal Normal abdominal exam  (+)   Peds  Hematology negative hematology ROS (+)   Anesthesia Other Findings   Reproductive/Obstetrics negative OB ROS                      Anesthesia Physical Anesthesia Plan  ASA: II  Anesthesia Plan: General   Post-op Pain Management:    Induction: Intravenous  Airway Management Planned: Oral ETT  Additional Equipment:   Intra-op Plan:   Post-operative Plan: Extubation in OR  Informed Consent: I have reviewed the patients History and Physical, chart, labs and discussed the procedure including the risks, benefits and alternatives for the proposed anesthesia with the patient or authorized representative who has indicated his/her understanding and acceptance.   Dental advisory given  Plan Discussed with: CRNA and Surgeon  Anesthesia Plan Comments:        Anesthesia Quick  Evaluation

## 2013-01-06 NOTE — Anesthesia Procedure Notes (Signed)
Procedure Name: Intubation Date/Time: 01/06/2013 10:12 AM Performed by: Tyrone Nine Pre-anesthesia Checklist: Patient identified, Timeout performed, Emergency Drugs available, Suction available and Patient being monitored Patient Re-evaluated:Patient Re-evaluated prior to inductionOxygen Delivery Method: Circle system utilized Preoxygenation: Pre-oxygenation with 100% oxygen Intubation Type: IV induction Ventilation: Mask ventilation without difficulty Laryngoscope Size: Miller and 2 Grade View: Grade I Tube type: Oral Tube size: 7.5 mm Number of attempts: 1 Airway Equipment and Method: Stylet Placement Confirmation: ETT inserted through vocal cords under direct vision,  breath sounds checked- equal and bilateral,  positive ETCO2 and CO2 detector Secured at: 22 cm Tube secured with: Tape Dental Injury: Teeth and Oropharynx as per pre-operative assessment

## 2013-01-06 NOTE — Preoperative (Signed)
Beta Blockers   Reason not to administer Beta Blockers:Not Applicable 

## 2013-01-06 NOTE — Progress Notes (Signed)
Orthopedic Tech Progress Note Patient Details:  Joanne [name] 07/26/1970 [id number]  Ortho Devices Type of Ortho Device: [place] cervical collar Ortho Device/Splint Location: neck Ortho Device/Splint Interventions: Application   Nikki Dom 01/06/2013, 6:49 PM

## 2013-01-06 NOTE — H&P (Signed)
PREOPERATIVE H&P  Chief Complaint: left arm pain  HPI: Joanne [name] is a 42 y.o. female who presents with left arm pain as well as balance deterioration. 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   Past Medical History  Diagnosis Date  . Tobacco abuse   . Irregular heartbeat     pt. states that she has been told that she has an irreg. heartbeat on occas.  . Anxiety     panic attacks - several yrs. ago, pt. reports that she is claustrophobic     . Shortness of breath   . GERD (gastroesophageal reflux disease)   . Headache(784.0)   . Cancer     hysterectomy for preCA of the cervix    . Anemia     h/o of Bld. Transfusion post C/Section  . Arthritis     shoulders- ? bursitis    Past Surgical History  Procedure Laterality Date  . Tonsillectomy    . Abdominal hysterectomy      bowel repair /w Davinci Robot procedure   . Cesarean section  1994    tubal ligation    History   Social History  . Marital Status: Divorced    Spouse Name: N/A    Number of Children: N/A  . Years of Education: N/A   Social History Main Topics  . Smoking status: Current Every Day Smoker -- 0.50 packs/day  . Smokeless tobacco: None  . Alcohol Use: No  . Drug Use: No  . Sexual Activity: Not Currently   Other Topics Concern  . None   Social History Narrative  . None   Family History  Problem Relation Age of Onset  . Depression Mother   . Heart attack Father   . Hypertension Father   . Cancer Sister   . Alcohol abuse Brother   . Diabetes Maternal Grandmother   . Cancer Sister    Allergies  Allergen Reactions  . Morphine And Related Anaphylaxis   Prior to Admission medications   Medication Sig Start Date End Date Taking? Authorizing Provider  cyclobenzaprine (FLEXERIL) 10 MG tablet Take 10 mg by mouth 2 (two) times daily as needed for muscle spasms.   Yes Historical Provider, MD  estradiol (ESTRACE) 0.1 MG/GM vaginal cream Place 1 g vaginally daily.   Yes Historical Provider, MD  gabapentin  (NEURONTIN) 300 MG capsule Take 300 mg by mouth 3 (three) times daily as needed (pain).   Yes Historical Provider, MD  naproxen (NAPROSYN) 500 MG tablet Take 500 mg by mouth daily as needed (pain).   Yes Historical Provider, MD  omeprazole (PRILOSEC) 40 MG capsule Take 40 mg by mouth daily.   Yes Historical Provider, MD  predniSONE (DELTASONE) 10 MG tablet 4 tabs po daily x 6 days, then 3 tabs po daily x 4 days, then 2 tabs po daily x 4 days, then 1 tab po daily x 4 days 12/09/12  Yes [name], MD  zolpidem (AMBIEN) 5 MG tablet Take 5 mg by mouth at bedtime as needed for sleep.   Yes Historical Provider, MD  AMBULATORY NON FORMULARY MEDICATION Knee-high graduated compression stockings. Apply to lower extremities. 12/09/12   [name], MD     All other systems have been reviewed and were otherwise negative with the exception of those mentioned in the HPI and as above.  Physical Exam: There were no vitals filed for this visit.  General: Alert, no acute distress Cardiovascular: No pedal edema Respiratory: No cyanosis, no use  of accessory musculature Skin: No lesions in the area of chief complaint Neurologic: Sensation intact distally Psychiatric: Patient is competent for consent with normal mood and affect Lymphatic: No axillary or cervical lymphadenopathy   Assessment/Plan: Left arm pain Plan for Procedure(s): ANTERIOR CERVICAL DECOMPRESSION/DISCECTOMY FUSION 2 LEVELS 5-7   Emilee Hero, MD 01/06/2013 7:06 AM

## 2013-01-07 ENCOUNTER — Encounter (HOSPITAL_COMMUNITY): Payer: Self-pay | Admitting: Orthopedic Surgery

## 2013-01-07 MED ORDER — HYDROCODONE-ACETAMINOPHEN 5-325 MG PO TABS: 1.0000 | ORAL_TABLET | ORAL | Status: DC | PRN

## 2013-01-07 NOTE — Progress Notes (Signed)
1 Day PO C5-7 ACDF, resolved left arm pain, mild posterior neck pain/tightness. Pt taking hydrocodone intermittently and without complaint, eager to proceed home. Denies swallowing difficulties/hoarseness of voice  BP 121/67  Pulse 80  Temp(Src) 98 F (36.7 C) (Oral)  Resp 16  SpO2 98% Pt sitting up comfortably in hospital bed, hard collar fitting appropriately, dressing C/D/I, NVI. 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

## 2013-01-07 NOTE — Progress Notes (Signed)
UR COMPLETED  

## 2013-01-07 NOTE — Op Note (Signed)
[name], [name]              ACCOUNT NO.:  [id number]  MEDICAL RECORD NO.:  [id number]  LOCATION:  [id number]                        FACILITY:  [facility]  PHYSICIAN:  Estill Bamberg, MD      DATE OF BIRTH:  May 27, 1970  DATE OF PROCEDURE: 01/06/2013                              OPERATIVE REPORT   PREOPERATIVE DIAGNOSES: 1. Left-sided cervical radiculopathy. 2. Cervical myelopathy. 3. C5-6, C6-7 degenerative disk disease.  POSTOPERATIVE DIAGNOSES: 1. Left-sided cervical radiculopathy. 2. Cervical myelopathy. 3. C5-6, C6-7 degenerative disk disease.  PROCEDURE: 1. C5-6, C6-7 anterior cervical decompression and fusion. 2. Insertion of anterior instrumentation, C5-C7. 3. Insertion of interbody device x2 (TITAN interbody spacer). 4. Use of morselized allograft (DBX mix). 5. Intraoperative use of fluoroscopy.  SURGEON:  Estill Bamberg, MD  ASSISTANT:  Jason Coop, PA-C  ANESTHESIA:  General endotracheal anesthesia.  COMPLICATIONS:  None.  DISPOSITION:  Stable.  ESTIMATED BLOOD LOSS:  Minimal.  INDICATIONS FOR PROCEDURE:  Briefly, Joanne [name] is a very pleasant 42- year-old female who did present to me on December 21, 2012, with left arm pain, as well as deterioration in her balance and fine motor skills. Her symptoms and exam was very much consistent with left-sided radiculopathy as well as cervical myelopathy.  I did review an MRI which was notable for a disk osteophyte complex causing neuroforaminal stenosis on the left side at C5-6 and C6-7.  Also of note, there was spinal cord compression to a moderate-to-severe degree at both the C5-6 and C6-7 levels.  I did feel that these findings clearly did correlate to the patient's symptomatology.  We therefore did have a discussion regarding proceeding with the C5-C7 anterior cervical decompression and fusion.  The patient did fully understand the risks and limitations of the procedure as outlined in my preoperative  note.  OPERATIVE DETAILS:  On January 06, 2013, the patient was brought to surgery and general endotracheal anesthesia was administered.  The patient was placed supine on the hospital bed.  Antibiotics were given and a time-out procedure was performed.  The neck was then prepped and draped in the usual sterile fashion.  I then made a left-sided transverse incision from the midline to the medial border of the sternocleidomastoid muscle.  The platysma was sharply incised.  The plane between the sternocleidomastoid muscle laterally and the strap muscles medially was identified and explored.  The carotid artery was palpated and retracted laterally, the esophagus and trachea were retracted medially.  The anterior cervical spine was readily noted.  A lateral intraoperative fluoroscopic view did confirm the appropriate operative level.  I then subperiosteally exposed the vertebral bodies of C5, C6, and C7.  A self-retaining retractor was placed.  Caspar pins were placed into the C6 and C7 vertebral bodies and distraction was applied.  I did use a 15-blade knife to perform the diskectomy.  The C6- 7 intervertebral disk was removed in its entirety.  This was performed to the level of the posterior longitudinal ligament and bilateral neural foraminal decompression was also obtained and confirmed using a nerve hook.  The endplates were then prepared.  I then placed an appropriately- sized TITAN interbody spacer, which was packed with DBX mix and tamped  into position in the usual fashion.  A Caspar pin was removed from the C7 vertebral body and bone wax was placed in its place.  An additional Caspar pin was placed into the C5 vertebral body and distraction was again applied.  Once again, a diskectomy was performed in the manner previously described.  I was able to remove all suggestion of spinal cord compression by removing the disk osteophyte complex.  This was confirmed by visualization of the  anterior cervical cord.  The neural foramina were also decompressed.  The endplates were again prepared and an appropriate-sized anterior interbody spacer was packed with DBX mix and tamped into position in the usual fashion.  At this point, an appropriate-sized anterior cervical plate was applied over the anterior cervical spine.  A 12 mm variable angle screws were placed, 2 in each vertebral body involving C5, C6 and C7 for a total of 6 screws.  The screws were then locked into the plate using the CAM locking mechanism. I did use AP and lateral fluoroscopy, and was very pleased with the appearance of the construct on imaging.  The wound was then copiously irrigated.  All bleeding was controlled.  The platysma was then closed using 2-0 Vicryl and the skin was then closed using 3-0 Monocryl. Benzoin and Steri-Strips were applied, followed by a sterile dressing. All instrument counts were correct at the termination of the procedure.  Of note, Jason Coop was my assistant throughout the entirety of the procedure, and did aid in essential retraction and suctioning needed throughout the entirety of the surgery.     Estill Bamberg, MD     MD/MEDQ  D:  01/06/2013  T:  01/07/2013  Job:  [id number]  cc:   [name], MD

## 2013-01-08 LAB — URINE CULTURE: Colony Count: >=100000

## 2013-01-10 NOTE — Discharge Summary (Signed)
Admit date: 01/06/2013   Discharge date: 01/07/2013   Admission Diagnoses: Cervical Myeloradiculopathy   Discharge Diagnoses: Same/improved  Past Medical History   Diagnosis  Date   .  Tobacco abuse    .  Irregular heartbeat      pt. states that she has been told that she has an irreg. heartbeat on occas.   .  Anxiety      panic attacks - several yrs. ago, pt. reports that she is claustrophobic   .  Shortness of breath    .  GERD (gastroesophageal reflux disease)    .  Headache(784.0)    .  Cancer      hysterectomy for preCA of the cervix   .  Anemia      h/o of Bld. Transfusion post C/Section   .  Arthritis      shoulders- ? bursitis    Surgeries: Procedure(s):  ANTERIOR CERVICAL DECOMPRESSION/DISCECTOMY FUSION 2 LEVELS C5-7 on 01/06/2013   Discharged Condition: Improved   Hospital Course: Joanne [name] is an 42 y.o. female who was admitted 01/06/2013 for operative treatment of myeloradiculopathy. Patient has severe unremitting pain that affects sleep, daily activities, and work/hobbies. After pre-op clearance the patient was taken to the operating room on 01/06/2013 and underwent Procedure(s):  ANTERIOR CERVICAL DECOMPRESSION/DISCECTOMY FUSION 2 LEVELS C5-7.   Patient was given perioperative antibiotics:  Anti-infectives    Start    Dose/Rate  Route  Frequency  Ordered  Stop    01/06/13 1830   ceFAZolin (ANCEF) IVPB 1 g/50 mL premix  1 g  100 mL/hr over 30 Minutes  Intravenous  Every 8 hours  01/06/13 1711  01/07/13 0207    01/06/13 1714   ceFAZolin (ANCEF) 1-5 GM-% IVPB     01/06/13 1714  01/07/13 0529      Comments: Jonna Munro : cabinet override         01/06/13 1000   levofloxacin (LEVAQUIN) IVPB 500 mg  500 mg  100 mL/hr over 60 Minutes  Intravenous  Once  01/06/13 0953  01/06/13 1237    01/06/13 0600   ceFAZolin (ANCEF) IVPB 2 g/50 mL premix  2 g  100 mL/hr over 30 Minutes  Intravenous  On call to O.R.  01/05/13 1338  01/06/13 1015      Patient was given  sequential compression devices, early ambulation to prevent DVT.   Patient benefited maximally from hospital stay and there were no complications.   Recent vital signs: BP 121/67  Pulse 80  Temp(Src) 98 F (36.7 C) (Oral)  Resp 16  SpO2 98%   Discharge Medications:    Medication List     STOP taking these medications       cyclobenzaprine 10 MG tablet    Commonly known as: FLEXERIL    gabapentin 300 MG capsule    Commonly known as: NEURONTIN    naproxen 500 MG tablet    Commonly known as: NAPROSYN    predniSONE 10 MG tablet    Commonly known as: DELTASONE     TAKE these medications       AMBULATORY NON FORMULARY MEDICATION    - Knee-high graduated compression stockings.  - Apply to lower extremities.    HYDROcodone-acetaminophen 5-325 MG per tablet    Commonly known as: NORCO    Take 1-2 tablets by mouth every 4 (four) hours as needed for pain.    omeprazole 40 MG capsule    Commonly known as: PRILOSEC  Take 40 mg by mouth daily.    zolpidem 5 MG tablet    Commonly known as: AMBIEN    Take 5 mg by mouth at bedtime as needed for sleep.      Diagnostic Studies: Dg Chest 2 View  01/03/2013 CLINICAL DATA: Preoperative evaluation for cervical spine fusion, smoker EXAM: CHEST 2 VIEW COMPARISON: None FINDINGS: Normal heart size, mediastinal contours, and pulmonary vascularity. Lungs clear. No pleural effusion or pneumothorax. Bones unremarkable. IMPRESSION: No acute abnormalities. Electronically Signed By: Ulyses Southward M.D. On: 01/03/2013 11:48  Dg Cervical Spine 1 View  01/06/2013 CLINICAL DATA: Cervical fusion EXAM: DG C-ARM 1-60 MIN; DG CERVICAL SPINE - 1 VIEW COMPARISON: 11/16/2012 FINDINGS: Anterior plate and screws are in place at C5, C6, and C7 with interposed disc spacers. C7 is obscured. Anatomic alignment through C6. IMPRESSION: Anterior fusion C5 through C7. Electronically Signed By: Maryclare Bean M.D. On: 01/06/2013 15:06  US Breast Right  12/17/2012 *RADIOLOGY REPORT*  Clinical Data: Abnormal screening mammogram. DIGITAL DIAGNOSTIC RIGHT MAMMOGRAM WITH CAD AND RIGHT BREAST ULTRASOUND: Comparison: 11/25/2012 Findings: ACR Breast Density Category b: There are scattered areas of fibroglandular density. Laterally exaggerated CC and spot compression views of the right breast were obtained. The previously questioned mass appears to disperse with additional imaging. However, due to breast density in this region, an ultrasound was performed. Mammographic images were processed with CAD. On physical exam, no palpable abnormality is identified. Targeted ultrasound is negative in the upper outer quadrant in the area of mammographic concern. IMPRESSION: BI-RADS CATEGORY 1: Negative. RECOMMENDATION: Screening mammogram in one year. (Code:SM-B-01Y) I have discussed the findings and recommendations with the patient. Results were also provided in writing at the conclusion of the visit. If applicable, a reminder letter will be sent to the patient regarding the next appointment. Original Report Authenticated By: Jerene Dilling, M.D.  [employer] [facility]  12/17/2012 *RADIOLOGY REPORT* Clinical Data: Abnormal screening mammogram. DIGITAL DIAGNOSTIC RIGHT MAMMOGRAM WITH CAD AND RIGHT BREAST ULTRASOUND: Comparison: 11/25/2012 Findings: ACR Breast Density Category b: There are scattered areas of fibroglandular density. Laterally exaggerated CC and spot compression views of the right breast were obtained. The previously questioned mass appears to disperse with additional imaging. However, due to breast density in this region, an ultrasound was performed. Mammographic images were processed with CAD. On physical exam, no palpable abnormality is identified. Targeted ultrasound is negative in the upper outer quadrant in the area of mammographic concern. IMPRESSION: BI-RADS CATEGORY 1: Negative. RECOMMENDATION: Screening mammogram in one year. (Code:SM-B-01Y) I have discussed the findings and  recommendations with the patient. Results were also provided in writing at the conclusion of the visit. If applicable, a reminder letter will be sent to the patient regarding the next appointment. Original Report Authenticated By: Jerene Dilling, M.D.  Dg C-arm 1-60 Min  01/06/2013 CLINICAL DATA: Cervical fusion EXAM: DG C-ARM 1-60 MIN; DG CERVICAL SPINE - 1 VIEW COMPARISON: 11/16/2012 FINDINGS: Anterior plate and screws are in place at C5, C6, and C7 with interposed disc spacers. C7 is obscured. Anatomic alignment through C6. IMPRESSION: Anterior fusion C5 through C7. Electronically Signed By: Maryclare Bean M.D. On: 01/06/2013 15:06   Disposition: 01-Home or Self Care   Pt 1 Day PO C5-7 ACDF for myeloradiculopathy, L arm px resolved  -D/C home today  -Norco Rx in chart, flexeril at home  -Hard Collar at all times 2 weeks, Philly collar for showering  -F/U in office 2 wks at scheduled appt   Signed:  Jason Coop  J  01/10/2013, 3:13 PM

## 2013-01-10 NOTE — Progress Notes (Signed)
Admit date: 01/06/2013 Discharge date: 01/07/2013  Admission Diagnoses: Cervical Myeloradiculopathy  Discharge Diagnoses:  Same  Past Medical History  Diagnosis Date  . Tobacco abuse   . Irregular heartbeat     pt. states that she has been told that she has an irreg. heartbeat on occas.  . Anxiety     panic attacks - several yrs. ago, pt. reports that she is claustrophobic     . Shortness of breath   . GERD (gastroesophageal reflux disease)   . Headache(784.0)   . Cancer     hysterectomy for preCA of the cervix    . Anemia     h/o of Bld. Transfusion post C/Section  . Arthritis     shoulders- ? bursitis     Surgeries: Procedure(s): ANTERIOR CERVICAL DECOMPRESSION/DISCECTOMY FUSION 2 LEVELS C5-7 on 01/06/2013   Discharged Condition: Improved  Hospital Course: JHADA RISK is an 42 y.o. female who was admitted 01/06/2013 for operative treatment of myeloradiculopathy. Patient has severe unremitting pain that affects sleep, daily activities, and work/hobbies. After pre-op clearance the patient was taken to the operating room on 01/06/2013 and underwent  Procedure(s): ANTERIOR CERVICAL DECOMPRESSION/DISCECTOMY FUSION 2 LEVELS C5-7.    Patient was given perioperative antibiotics:  Anti-infectives   Start     Dose/Rate Route Frequency Ordered Stop   01/06/13 1830  ceFAZolin (ANCEF) IVPB 1 g/50 mL premix     1 g 100 mL/hr over 30 Minutes Intravenous Every 8 hours 01/06/13 1711 01/07/13 0207   01/06/13 1714  ceFAZolin (ANCEF) 1-5 GM-% IVPB    Comments:  Jonna Munro   : cabinet override      01/06/13 1714 01/07/13 0529   01/06/13 1000  levofloxacin (LEVAQUIN) IVPB 500 mg     500 mg 100 mL/hr over 60 Minutes Intravenous  Once 01/06/13 0953 01/06/13 1237   01/06/13 0600  ceFAZolin (ANCEF) IVPB 2 g/50 mL premix     2 g 100 mL/hr over 30 Minutes Intravenous On call to O.R. 01/05/13 1338 01/06/13 1015       Patient was given sequential compression devices, early ambulation to  prevent DVT.  Patient benefited maximally from hospital stay and there were no complications.    Recent vital signs: BP 121/67  Pulse 80  Temp(Src) 98 F (36.7 C) (Oral)  Resp 16  SpO2 98%   Discharge Medications:     Medication List    STOP taking these medications       cyclobenzaprine 10 MG tablet  Commonly known as:  FLEXERIL     gabapentin 300 MG capsule  Commonly known as:  NEURONTIN     naproxen 500 MG tablet  Commonly known as:  NAPROSYN     predniSONE 10 MG tablet  Commonly known as:  DELTASONE      TAKE these medications       AMBULATORY NON FORMULARY MEDICATION  - Knee-high graduated compression stockings.  - Apply to lower extremities.     HYDROcodone-acetaminophen 5-325 MG per tablet  Commonly known as:  NORCO  Take 1-2 tablets by mouth every 4 (four) hours as needed for pain.     omeprazole 40 MG capsule  Commonly known as:  PRILOSEC  Take 40 mg by mouth daily.     zolpidem 5 MG tablet  Commonly known as:  AMBIEN  Take 5 mg by mouth at bedtime as needed for sleep.        Diagnostic Studies: Dg Chest 2 View  01/03/2013  CLINICAL DATA:  Preoperative evaluation for cervical spine fusion, smoker  EXAM: CHEST  2 VIEW  COMPARISON:  None  FINDINGS: Normal heart size, mediastinal contours, and pulmonary vascularity.  Lungs clear.  No pleural effusion or pneumothorax.  Bones unremarkable.  IMPRESSION: No acute abnormalities.   Electronically Signed   By: Ulyses Southward M.D.   On: 01/03/2013 11:48   Dg Cervical Spine 1 View  01/06/2013   CLINICAL DATA:  Cervical fusion  EXAM: DG C-ARM 1-60 MIN; DG CERVICAL SPINE - 1 VIEW  COMPARISON:  11/16/2012  FINDINGS: Anterior plate and screws are in place at C5, C6, and C7 with interposed disc spacers. C7 is obscured. Anatomic alignment through C6.  IMPRESSION: Anterior fusion C5 through C7.   Electronically Signed   By: Maryclare Bean M.D.   On: 01/06/2013 15:06   US Breast Right  12/17/2012   *RADIOLOGY REPORT*   Clinical Data:  Abnormal screening mammogram.  DIGITAL DIAGNOSTIC RIGHT MAMMOGRAM WITH CAD AND RIGHT BREAST ULTRASOUND:  Comparison:  11/25/2012  Findings:  ACR Breast Density Category b:  There are scattered areas of fibroglandular density.  Laterally exaggerated CC and spot compression views of the right breast were obtained.  The previously questioned mass appears to disperse with additional imaging.  However, due to breast density in this region, an ultrasound was performed.  Mammographic images were processed with CAD.  On physical exam, no palpable abnormality is identified.  Targeted ultrasound is negative in the upper outer quadrant in the area of mammographic concern.  IMPRESSION: BI-RADS CATEGORY 1:  Negative.  RECOMMENDATION: Screening mammogram in one year. (Code:SM-B-01Y)  I have discussed the findings and recommendations with the patient. Results were also provided in writing at the conclusion of the visit.  If applicable, a reminder letter will be sent to the patient regarding the next appointment.   Original Report Authenticated By: Jerene Dilling, M.D.   [employer] [facility]  12/17/2012   *RADIOLOGY REPORT*  Clinical Data:  Abnormal screening mammogram.  DIGITAL DIAGNOSTIC RIGHT MAMMOGRAM WITH CAD AND RIGHT BREAST ULTRASOUND:  Comparison:  11/25/2012  Findings:  ACR Breast Density Category b:  There are scattered areas of fibroglandular density.  Laterally exaggerated CC and spot compression views of the right breast were obtained.  The previously questioned mass appears to disperse with additional imaging.  However, due to breast density in this region, an ultrasound was performed.  Mammographic images were processed with CAD.  On physical exam, no palpable abnormality is identified.  Targeted ultrasound is negative in the upper outer quadrant in the area of mammographic concern.  IMPRESSION: BI-RADS CATEGORY 1:  Negative.  RECOMMENDATION: Screening mammogram in one year. (Code:SM-B-01Y)  I  have discussed the findings and recommendations with the patient. Results were also provided in writing at the conclusion of the visit.  If applicable, a reminder letter will be sent to the patient regarding the next appointment.   Original Report Authenticated By: Jerene Dilling, M.D.   Dg C-arm 1-60 Min  01/06/2013   CLINICAL DATA:  Cervical fusion  EXAM: DG C-ARM 1-60 MIN; DG CERVICAL SPINE - 1 VIEW  COMPARISON:  11/16/2012  FINDINGS: Anterior plate and screws are in place at C5, C6, and C7 with interposed disc spacers. C7 is obscured. Anatomic alignment through C6.  IMPRESSION: Anterior fusion C5 through C7.   Electronically Signed   By: Maryclare Bean M.D.   On: 01/06/2013 15:06    Disposition: 01-Home or Self Care  Pt 1 Day PO C5-7 ACDF for myeloradiculopathy, L arm px resolved  -D/C home today  -Norco Rx in chart, flexeril at home  -Hard Collar at all times 2 weeks, [place]ly collar for showering  -F/U in office 2 wks at scheduled appt   Signed: Georga Bora 01/10/2013, 3:13 PM

## 2013-02-05 HISTORY — PX: DENTAL SURGERY: SHX609

## 2013-02-18 ENCOUNTER — Encounter: Payer: Self-pay | Admitting: Sports Medicine

## 2013-02-18 ENCOUNTER — Ambulatory Visit (INDEPENDENT_AMBULATORY_CARE_PROVIDER_SITE_OTHER): Payer: BC Managed Care – PPO | Admitting: Sports Medicine

## 2013-02-18 ENCOUNTER — Telehealth: Payer: Self-pay | Admitting: Physician Assistant

## 2013-02-18 VITALS — BP 119/82 | HR 80

## 2013-02-18 DIAGNOSIS — M79609 Pain in unspecified limb: Secondary | ICD-10-CM

## 2013-02-18 DIAGNOSIS — M5030 Other cervical disc degeneration, unspecified cervical region: Secondary | ICD-10-CM

## 2013-02-18 DIAGNOSIS — M79671 Pain in right foot: Secondary | ICD-10-CM | POA: Insufficient documentation

## 2013-02-18 MED ORDER — CYCLOBENZAPRINE HCL 10 MG PO TABS: ORAL_TABLET | ORAL | Status: DC

## 2013-02-18 MED ORDER — HYDROCODONE-ACETAMINOPHEN 5-325 MG PO TABS: 1.0000 | ORAL_TABLET | ORAL | Status: DC | PRN

## 2013-02-18 NOTE — Telephone Encounter (Signed)
Patient request to change providers from [facility] to Dr. Karie Schwalbe has seen [facility] once and Dr. Karie Schwalbe there after. Thanks

## 2013-02-18 NOTE — Assessment & Plan Note (Signed)
Status post two-level fusion, C5-6, C6-7 by Dr. Yevette Edwards. She is 6 weeks postop, still having some pain and muscle spasm. I am amenable to giving her a bit of hydrocodone as well as some Flexeril for use at night, she understands this will not be long term. She can followup with Dr. Yevette Edwards regarding this problem.

## 2013-02-18 NOTE — Telephone Encounter (Signed)
Ok

## 2013-02-18 NOTE — Assessment & Plan Note (Signed)
Sounds to be classic metatarsalgia. She will return for custom orthotics likely with metatarsal pads.

## 2013-02-18 NOTE — Progress Notes (Signed)
  Subjective:    CC: Follow up  HPI: This pleasant 42 year old female is approximately 6 weeks status post 2 level ACDF, shoulder pain is essentially resolved, lower extremity symptoms have improved significantly. She does have some pain which is not controlled with Tylenol No. 3. She also has a good amount of muscle spasms at night. Pain is moderate, persistent but improving, no bowel or bladder dysfunction. Pain is localized in neck, no radiation.  Past medical history, Surgical history, Family history not pertinant except as noted below, Social history, Allergies, and medications have been entered into the medical record, reviewed, and no changes needed.   Review of Systems: No fevers, chills, night sweats, weight loss, chest pain, or shortness of breath.   Objective:    General: Well Developed, well nourished, and in no acute distress.  Neuro: Alert and oriented x3, extra-ocular muscles intact, sensation grossly intact.  HEENT: Normocephalic, atraumatic, pupils equal round reactive to light, neck supple, no masses, no lymphadenopathy, thyroid nonpalpable.  Skin: Warm and dry, no rashes. Cardiac: Regular rate and rhythm, no murmurs rubs or gallops, no lower extremity edema.  Respiratory: Clear to auscultation bilaterally. Not using accessory muscles, speaking in full sentences. Neck: Inspection unremarkable. Well-healed surgical scar. No palpable stepoffs. Negative Spurling's maneuver. Range of motion is somewhat limited by pain. Grip strength and sensation normal in bilateral hands Strength good C4 to T1 distribution No sensory change to C4 to T1 Negative Hoffman sign bilaterally Reflexes normal  Impression and Recommendations:

## 2013-02-18 NOTE — Telephone Encounter (Signed)
I suppose.

## 2013-02-28 ENCOUNTER — Ambulatory Visit (INDEPENDENT_AMBULATORY_CARE_PROVIDER_SITE_OTHER): Payer: BC Managed Care – PPO | Admitting: Sports Medicine

## 2013-02-28 ENCOUNTER — Encounter: Payer: Self-pay | Admitting: Sports Medicine

## 2013-02-28 VITALS — BP 121/73 | HR 92 | Wt 171.0 lb

## 2013-02-28 DIAGNOSIS — G4700 Insomnia, unspecified: Secondary | ICD-10-CM

## 2013-02-28 DIAGNOSIS — M79671 Pain in right foot: Secondary | ICD-10-CM

## 2013-02-28 DIAGNOSIS — M79609 Pain in unspecified limb: Secondary | ICD-10-CM

## 2013-02-28 MED ORDER — HYDROXYZINE HCL 50 MG PO TABS: 50.0000 mg | ORAL_TABLET | Freq: Every day | ORAL | Status: DC

## 2013-02-28 NOTE — Assessment & Plan Note (Signed)
Adding hydroxyzine each bedtime.

## 2013-02-28 NOTE — Progress Notes (Signed)
    Patient was fitted for a : standard, cushioned, semi-rigid orthotic. The orthotic was heated and afterward the patient stood on the orthotic blank positioned on the orthotic stand. The patient was positioned in subtalar neutral position and 10 degrees of ankle dorsiflexion in a weight bearing stance. After completion of molding, a stable base was applied to the orthotic blank. The blank was ground to a stable position for weight bearing. Size: 8 Base: Blue EVA Additional Posting and Padding: None The patient ambulated these, and they were very comfortable.  I spent 40 minutes with this patient, greater than 50% was face-to-face time counseling regarding the below diagnosis.   

## 2013-02-28 NOTE — Assessment & Plan Note (Signed)
Custom orthotics as above. 

## 2013-03-10 ENCOUNTER — Telehealth: Payer: Self-pay | Admitting: *Deleted

## 2013-03-10 NOTE — Telephone Encounter (Signed)
Pt calls today stating that the hydroxyzine you gave her for sleep is not helping. She said that she is just lying there half the night wide awake.  She wants to know if you can give her something different or if she needs to f/u with you again.  Please advise.

## 2013-03-11 ENCOUNTER — Other Ambulatory Visit: Payer: Self-pay

## 2013-03-11 MED ORDER — ZOLPIDEM TARTRATE 10 MG PO TABS: 10.0000 mg | ORAL_TABLET | Freq: Every evening | ORAL | Status: DC | PRN

## 2013-03-11 NOTE — Telephone Encounter (Signed)
10 mg will be waiting in my box, this is very strong, particularly in women, so be careful with it.

## 2013-03-11 NOTE — Telephone Encounter (Signed)
Pt states that she was on ambien 5mg  before & it helped her fall asleep but it didn't keep her asleep. She wants to know if you can increase the strength?

## 2013-03-11 NOTE — Telephone Encounter (Signed)
Would she like to try a short course of Ambien?

## 2013-03-11 NOTE — Telephone Encounter (Signed)
Pt.notified

## 2013-06-07 ENCOUNTER — Ambulatory Visit (INDEPENDENT_AMBULATORY_CARE_PROVIDER_SITE_OTHER): Payer: BC Managed Care – PPO

## 2013-06-07 ENCOUNTER — Ambulatory Visit (INDEPENDENT_AMBULATORY_CARE_PROVIDER_SITE_OTHER): Payer: BC Managed Care – PPO | Admitting: Sports Medicine

## 2013-06-07 ENCOUNTER — Encounter: Payer: Self-pay | Admitting: Sports Medicine

## 2013-06-07 VITALS — BP 113/74 | HR 88 | Wt 168.0 lb

## 2013-06-07 DIAGNOSIS — M5416 Radiculopathy, lumbar region: Secondary | ICD-10-CM

## 2013-06-07 DIAGNOSIS — M549 Dorsalgia, unspecified: Secondary | ICD-10-CM

## 2013-06-07 DIAGNOSIS — M79672 Pain in left foot: Principal | ICD-10-CM

## 2013-06-07 DIAGNOSIS — M79671 Pain in right foot: Secondary | ICD-10-CM

## 2013-06-07 DIAGNOSIS — IMO0002 Reserved for concepts with insufficient information to code with codable children: Secondary | ICD-10-CM

## 2013-06-07 NOTE — Assessment & Plan Note (Signed)
Metatarsalgia has resolved with custom orthotics.

## 2013-06-07 NOTE — Assessment & Plan Note (Signed)
Seems to be classic S1 versus L5. Formal physical therapy, x-rays, return to see me in one month, MRI for interventional injection planning if no better.

## 2013-06-07 NOTE — Progress Notes (Signed)
  Subjective:    CC: Followup  HPI: Metatarsalgia: Resolved with custom orthotics.  Cervical degenerative disc disease: Now status post a 2 level C5-C6 and C6-C7 ACDF, doing well.  Right leg pain: Burning, localized in the right lower leg radiating down to the right lateral foot, moderate, persistent, worse with flexion and Valsalva.  Past medical history, Surgical history, Family history not pertinant except as noted below, Social history, Allergies, and medications have been entered into the medical record, reviewed, and no changes needed.   Review of Systems: No fevers, chills, night sweats, weight loss, chest pain, or shortness of breath.   Objective:    General: Well Developed, well nourished, and in no acute distress.  Neuro: Alert and oriented x3, extra-ocular muscles intact, sensation grossly intact.  HEENT: Normocephalic, atraumatic, pupils equal round reactive to light, neck supple, no masses, no lymphadenopathy, thyroid nonpalpable.  Skin: Warm and dry, no rashes. Cardiac: Regular rate and rhythm, no murmurs rubs or gallops, no lower extremity edema.  Respiratory: Clear to auscultation bilaterally. Not using accessory muscles, speaking in full sentences. Back Exam:  Inspection: Unremarkable  Motion: Flexion 45 deg, Extension 45 deg, Side Bending to 45 deg bilaterally,  Rotation to 45 deg bilaterally  SLR laying: Positive with reproduction of right-sided radicular symptoms.  XSLR laying: Negative  Palpable tenderness: None. FABER: negative. Sensory change: Gross sensation intact to all lumbar and sacral dermatomes.  Reflexes: 2+ at both patellar tendons, 2+ at achilles tendons, Babinski's downgoing.  Strength at foot  Plantar-flexion: 5/5 Dorsi-flexion: 5/5 Eversion: 5/5 Inversion: 5/5  Leg strength  Quad: 5/5 Hamstring: 5/5 Hip flexor: 5/5 Hip abductors: 5/5  Gait unremarkable.  Lumbar spine x-rays do show some straightening of the normal alignment, otherwise  okay.  Impression and Recommendations:

## 2013-06-21 ENCOUNTER — Ambulatory Visit: Payer: BC Managed Care – PPO | Admitting: Sports Medicine

## 2013-06-24 ENCOUNTER — Ambulatory Visit (INDEPENDENT_AMBULATORY_CARE_PROVIDER_SITE_OTHER): Payer: BC Managed Care – PPO | Admitting: Sports Medicine

## 2013-06-24 ENCOUNTER — Encounter: Payer: Self-pay | Admitting: Sports Medicine

## 2013-06-24 ENCOUNTER — Ambulatory Visit (INDEPENDENT_AMBULATORY_CARE_PROVIDER_SITE_OTHER): Payer: BC Managed Care – PPO

## 2013-06-24 VITALS — BP 109/74 | HR 95 | Ht 65.0 in | Wt 168.0 lb

## 2013-06-24 DIAGNOSIS — R05 Cough: Secondary | ICD-10-CM

## 2013-06-24 DIAGNOSIS — R059 Cough, unspecified: Secondary | ICD-10-CM

## 2013-06-24 DIAGNOSIS — R1011 Right upper quadrant pain: Secondary | ICD-10-CM | POA: Insufficient documentation

## 2013-06-24 DIAGNOSIS — J42 Unspecified chronic bronchitis: Secondary | ICD-10-CM | POA: Insufficient documentation

## 2013-06-24 MED ORDER — AZITHROMYCIN 250 MG PO TABS: ORAL_TABLET | ORAL | Status: DC

## 2013-06-24 MED ORDER — BENZONATATE 200 MG PO CAPS: 200.0000 mg | ORAL_CAPSULE | Freq: Three times a day (TID) | ORAL | Status: DC | PRN

## 2013-06-24 MED ORDER — HYDROCOD POLST-CHLORPHEN POLST 10-8 MG/5ML PO LQCR: 5.0000 mL | Freq: Two times a day (BID) | ORAL | Status: DC | PRN

## 2013-06-24 NOTE — Assessment & Plan Note (Signed)
Present now for 3 weeks, although most likely postinfectious we are going to obtain a chest x-ray, azithromycin, and Tessalon Perles as well as Tussionex. Return as needed.

## 2013-06-24 NOTE — Progress Notes (Signed)
  Subjective:    CC: Persistent cough  HPI: This pleasant 43 year old female returns, for the past 3 weeks she's had a persistent, productive cough, following a recent upper respiratory infection. Cough is moderate, persistent, no fevers, chills, weight loss, or other constitutional symptoms, no skin rash, no GI symptoms.  Right upper quadrant abdominal pain: Occurs after eating greasy meals, colicky, moderate, persistent.  Past medical history, Surgical history, Family history not pertinant except as noted below, Social history, Allergies, and medications have been entered into the medical record, reviewed, and no changes needed.   Review of Systems: No fevers, chills, night sweats, weight loss, chest pain, or shortness of breath.   Objective:    General: Well Developed, well nourished, and in no acute distress.  Neuro: Alert and oriented x3, extra-ocular muscles intact, sensation grossly intact.  HEENT: Normocephalic, atraumatic, pupils equal round reactive to light, neck supple, no masses, no lymphadenopathy, thyroid nonpalpable.  Skin: Warm and dry, no rashes. Cardiac: Regular rate and rhythm, no murmurs rubs or gallops, no lower extremity edema.  Respiratory: Clear to auscultation bilaterally. Not using accessory muscles, speaking in full sentences.  Impression and Recommendations:

## 2013-06-24 NOTE — Assessment & Plan Note (Signed)
Right upper heart, colicky, worse with greasy meals. Rather than pursuing any testing, this does sound so classically like biliary colic I am going to just send her to the general surgeon.

## 2013-07-04 ENCOUNTER — Encounter (INDEPENDENT_AMBULATORY_CARE_PROVIDER_SITE_OTHER): Payer: Self-pay | Admitting: Surgery

## 2013-07-04 ENCOUNTER — Ambulatory Visit (INDEPENDENT_AMBULATORY_CARE_PROVIDER_SITE_OTHER): Payer: BC Managed Care – PPO | Admitting: Surgery

## 2013-07-04 ENCOUNTER — Telehealth (INDEPENDENT_AMBULATORY_CARE_PROVIDER_SITE_OTHER): Payer: Self-pay | Admitting: *Deleted

## 2013-07-04 VITALS — BP 118/80 | HR 75 | Temp 97.8°F | Resp 16 | Ht 65.0 in | Wt 165.8 lb

## 2013-07-04 DIAGNOSIS — R1031 Right lower quadrant pain: Secondary | ICD-10-CM | POA: Insufficient documentation

## 2013-07-04 NOTE — Telephone Encounter (Signed)
Called and made pt aware of her [place] appt at [address] 4.2.15 @ 8:15 a.m.  NPO after midnight .  Pt understands and is in agreeance.Joanne [name]

## 2013-07-04 NOTE — Progress Notes (Signed)
Patient ID: Joanne [name], female   DOB: [birth year], 43 y.o.   MRN: [id number]  Chief Complaint  Patient presents with  . eval gallbladder    new pt    HPI Joanne [name] is a 43 y.o. female.  Pt sent at the request of Dr  HPI Joanne Field, MD for right upper quadrant abdominal pain.  Has had intermittent pain on and off for 21 years.  Location RUQ and after eating.  Lasts 30 minutes and goes away on its own.  Fatty greasy food the worse.  Heartburn as well.  Minimal nausea.    Past Medical History  Diagnosis Date  . Tobacco abuse   . Irregular heartbeat     pt. states that she has been told that she has an irreg. heartbeat on occas.  . Anxiety     panic attacks - several yrs. ago, pt. reports that she is claustrophobic     . Shortness of breath   . GERD (gastroesophageal reflux disease)   . Headache(784.0)   . Cancer     hysterectomy for preCA of the cervix    . Anemia     h/o of Bld. Transfusion post C/Section  . Arthritis     shoulders- ? bursitis     Past Surgical History  Procedure Laterality Date  . Tonsillectomy    . Abdominal hysterectomy      bowel repair /w Davinci Robot procedure   . Cesarean section  1994    tubal ligation   . Anterior cervical decomp/discectomy fusion N/A 01/06/2013    Procedure: ANTERIOR CERVICAL DECOMPRESSION/DISCECTOMY FUSION 2 LEVELS;  Surgeon: Sinclair Ship, MD;  Location: [facility];  Service: Orthopedics;  Laterality: N/A;  Anterior cervical decompression fusion, cervical 5-6, cervical 6-7 with instrumentation and allograft    Family History  Problem Relation Age of Onset  . Depression Mother   . Heart attack Father   . Hypertension Father   . Cancer Sister   . Alcohol abuse Brother   . Diabetes Maternal Grandmother   . Cancer Sister     Social History History  Substance Use Topics  . Smoking status: Current Every Day Smoker -- 0.50 packs/day  . Smokeless tobacco: Not on file  . Alcohol Use: No    Allergies    Allergen Reactions  . Morphine And Related Anaphylaxis    Current Outpatient Prescriptions  Medication Sig Dispense Refill  . AMBULATORY NON FORMULARY MEDICATION Knee-high graduated compression stockings. Apply to lower extremities.  1 each  0  . cyclobenzaprine (FLEXERIL) 10 MG tablet One half tab PO qHS, then increase gradually to one tab TID.  30 tablet  0  . HYDROcodone-acetaminophen (NORCO) 5-325 MG per tablet Take 1-2 tablets by mouth every 4 (four) hours as needed.  60 tablet  0  . hydrOXYzine (ATARAX/VISTARIL) 50 MG tablet Take 1 tablet (50 mg total) by mouth at bedtime.  90 tablet  3  . omeprazole (PRILOSEC) 40 MG capsule Take 40 mg by mouth daily.      Marland Kitchen zolpidem (AMBIEN) 10 MG tablet Take 1 tablet (10 mg total) by mouth at bedtime as needed for sleep.  30 tablet  0   No current facility-administered medications for this visit.    Review of Systems Review of Systems  Constitutional: Negative for fever, chills and unexpected weight change.  HENT: Negative for congestion, hearing loss, sore throat, trouble swallowing and voice change.   Eyes: Negative for visual disturbance.  Respiratory: Negative for  cough and wheezing.   Cardiovascular: Negative for chest pain, palpitations and leg swelling.  Gastrointestinal: Positive for abdominal pain. Negative for nausea, vomiting, diarrhea, constipation, blood in stool, abdominal distention and anal bleeding.  Genitourinary: Negative for hematuria, vaginal bleeding and difficulty urinating.  Musculoskeletal: Negative for arthralgias.  Skin: Negative for rash and wound.  Neurological: Negative for seizures, syncope and headaches.  Hematological: Negative for adenopathy. Does not bruise/bleed easily.  Psychiatric/Behavioral: Negative for confusion.    Blood pressure 118/80, pulse 75, temperature 97.8 F (36.6 C), temperature source Oral, resp. rate 16, height 5\' 5"  (1.651 m), weight 165 lb 12.8 oz (75.206 kg).  Physical  Exam Physical Exam  Constitutional: She appears well-developed and well-nourished.  HENT:  Head: Normocephalic and atraumatic.  Eyes: EOM are normal. Pupils are equal, round, and reactive to light.  Neck: Normal range of motion. Neck supple.  Cardiovascular: Normal rate and regular rhythm.   Pulmonary/Chest: Effort normal and breath sounds normal.  Abdominal: Soft. Bowel sounds are normal. She exhibits no distension and no mass. There is no tenderness. There is no rebound and no guarding.      Data Reviewed none  Assessment    Right upper quadrant abdominal pain    Plan    Need U/S to evaluate for gallstones.  If positive,  Would Benefits from laparoscopic cholecystectomy and cholangiogram. If negative, she will require a HIDA study. Further recommendations after workup complete. Information about gallbladder disease given since this would be the most likely diagnosis.       [name] 07/04/2013, 10:45 AM

## 2013-07-04 NOTE — Patient Instructions (Addendum)
Schedule for U/S       Laparoscopic Cholecystectomy Laparoscopic cholecystectomy is surgery to remove the gallbladder. The gallbladder is located in the upper right part of the abdomen, behind the liver. It is a storage sac for bile produced in the liver. Bile aids in the digestion and absorption of fats. Cholecystectomy is often done for inflammation of the gallbladder (cholecystitis). This condition is usually caused by a buildup of gallstones (cholelithiasis) in your gallbladder. Gallstones can block the flow of bile, resulting in inflammation and pain. In severe cases, emergency surgery may be required. When emergency surgery is not required, you will have time to prepare for the procedure. Laparoscopic surgery is an alternative to open surgery. Laparoscopic surgery has a shorter recovery time. Your common bile duct may also need to be examined during the procedure. If stones are found in the common bile duct, they may be removed. LET [facility] CARE PROVIDER KNOW ABOUT:  Any allergies you have.  All medicines you are taking, including vitamins, herbs, eye drops, creams, and over-the-counter medicines.  Previous problems you or members of your family have had with the use of anesthetics.  Any blood disorders you have.  Previous surgeries you have had.  Medical conditions you have. RISKS AND COMPLICATIONS Generally, this is a safe procedure. However, as with any procedure, complications can occur. Possible complications include:  Infection.  Damage to the common bile duct, nerves, arteries, veins, or other internal organs such as the stomach, liver, or intestines.  Bleeding.  A stone may remain in the common bile duct.  A bile leak from the cyst duct that is clipped when your gallbladder is removed.  The need to convert to open surgery, which requires a larger incision in the abdomen. This may be necessary if your surgeon thinks it is not safe to continue with a laparoscopic  procedure. BEFORE THE PROCEDURE  Ask your health care provider about changing or stopping any regular medicines. You will need to stop taking aspirin or blood thinners at least 5 days prior to surgery.  Do not eat or drink anything after midnight the night before surgery.  Let your health care provider know if you develop a cold or other infectious problem before surgery. PROCEDURE   You will be given medicine to make you sleep through the procedure (general anesthetic). A breathing tube will be placed in your mouth.  When you are asleep, your surgeon will make several small cuts (incisions) in your abdomen.  A thin, lighted tube with a tiny camera on the end (laparoscope) is inserted through one of the small incisions. The camera on the laparoscope sends a picture to a TV screen in the operating room. This gives the surgeon a good view inside your abdomen.  A gas will be pumped into your abdomen. This expands your abdomen so that the surgeon has more room to perform the surgery.  Other tools needed for the procedure are inserted through the other incisions. The gallbladder is removed through one of the incisions.  After the removal of your gallbladder, the incisions will be closed with stitches, staples, or skin glue. AFTER THE PROCEDURE  You will be taken to a recovery area where your progress will be checked often.  You may be allowed to go home the same day if your pain is controlled and you can tolerate liquids. Document Released: 03/24/2005 Document Revised: 01/12/2013 Document Reviewed: 11/03/2012 [facility] Patient Information 2014 [place]. Cholelithiasis Cholelithiasis (also called gallstones) is a form  of gallbladder disease in which gallstones form in your gallbladder. The gallbladder is an organ that stores bile made in the liver, which helps digest fats. Gallstones begin as small crystals and slowly grow into stones. Gallstone pain occurs when the gallbladder spasms  and a gallstone is blocking the duct. Pain can also occur when a stone passes out of the duct.  RISK FACTORS  Being female.   Having multiple pregnancies. Health care providers sometimes advise removing diseased gallbladders before future pregnancies.   Being obese.  Eating a diet heavy in fried foods and fat.   Being older than 78 years and increasing age.   Prolonged use of medicines containing female hormones.   Having diabetes mellitus.   Rapidly losing weight.   Having a family history of gallstones (heredity).  SYMPTOMS  Nausea.   Vomiting.  Abdominal pain.   Yellowing of the skin (jaundice).   Sudden pain. It may persist from several minutes to several hours.  Fever.   Tenderness to the touch. In some cases, when gallstones do not move into the bile duct, people have no pain or symptoms. These are called "silent" gallstones.  TREATMENT Silent gallstones do not need treatment. In severe cases, emergency surgery may be required. Options for treatment include:  Surgery to remove the gallbladder. This is the most common treatment.  Medicines. These do not always work and may take 6 12 months or more to work.  Shock wave treatment (extracorporeal biliary lithotripsy). In this treatment an ultrasound machine sends shock waves to the gallbladder to break gallstones into smaller pieces that can pass into the intestines or be dissolved by medicine. HOME CARE INSTRUCTIONS   Only take over-the-counter or prescription medicines for pain, discomfort, or fever as directed by your health care provider.   Follow a low-fat diet until seen again by your health care provider. Fat causes the gallbladder to contract, which can result in pain.   Follow up with your health care provider as directed. Attacks are almost always recurrent and surgery is usually required for permanent treatment.  SEEK IMMEDIATE MEDICAL CARE IF:   Your pain increases and is not  controlled by medicines.   You have a fever or persistent symptoms for more than 2 3 days.   You have a fever and your symptoms suddenly get worse.   You have persistent nausea and vomiting.  MAKE SURE YOU:   Understand these instructions.  Will watch your condition.  Will get help right away if you are not doing well or get worse. Document Released: 03/20/2005 Document Revised: 11/24/2012 Document Reviewed: 09/15/2012 [facility] Patient Information 2014 [place].

## 2013-07-05 ENCOUNTER — Ambulatory Visit: Payer: BC Managed Care – PPO | Admitting: Sports Medicine

## 2013-07-08 ENCOUNTER — Other Ambulatory Visit (INDEPENDENT_AMBULATORY_CARE_PROVIDER_SITE_OTHER): Payer: Self-pay | Admitting: Surgery

## 2013-07-08 ENCOUNTER — Ambulatory Visit
Admission: RE | Admit: 2013-07-08 | Discharge: 2013-07-08 | Disposition: A | Discharge status: Home / Self Care | Payer: BC Managed Care – PPO | Source: Ambulatory Visit | Attending: Surgery | Admitting: Surgery

## 2013-07-08 DIAGNOSIS — R1011 Right upper quadrant pain: Secondary | ICD-10-CM

## 2013-07-12 ENCOUNTER — Other Ambulatory Visit (INDEPENDENT_AMBULATORY_CARE_PROVIDER_SITE_OTHER): Payer: Self-pay

## 2013-07-12 ENCOUNTER — Telehealth (INDEPENDENT_AMBULATORY_CARE_PROVIDER_SITE_OTHER): Payer: Self-pay

## 2013-07-12 DIAGNOSIS — R1031 Right lower quadrant pain: Secondary | ICD-10-CM

## 2013-07-12 NOTE — Telephone Encounter (Signed)
Caled pt with normal [place] results. Pt is wanting to set up HIDA scan. Order in computer and given to [place] to schedule.

## 2013-07-12 NOTE — Telephone Encounter (Signed)
Message copied by Carlene Coria on Tue Jul 12, 2013  2:02 PM ------      Message from: Tami Lin      Created: Tue Jul 12, 2013 12:54 PM      Regarding: Dr Brantley Stage      Contact: [phone]       Would like synogram results from 07/08/13 (Fri). ------

## 2013-07-25 ENCOUNTER — Encounter: Payer: Self-pay | Admitting: Sports Medicine

## 2013-07-25 ENCOUNTER — Encounter (HOSPITAL_COMMUNITY)
Admission: RE | Admit: 2013-07-25 | Discharge: 2013-07-25 | Disposition: A | Discharge status: Home / Self Care | Payer: BC Managed Care – PPO | Source: Ambulatory Visit | Attending: Surgery | Admitting: Surgery

## 2013-07-25 ENCOUNTER — Ambulatory Visit (INDEPENDENT_AMBULATORY_CARE_PROVIDER_SITE_OTHER): Payer: BC Managed Care – PPO | Admitting: Sports Medicine

## 2013-07-25 VITALS — BP 115/67 | HR 85 | Ht 65.0 in | Wt 165.0 lb

## 2013-07-25 DIAGNOSIS — R059 Cough, unspecified: Secondary | ICD-10-CM

## 2013-07-25 DIAGNOSIS — R05 Cough: Secondary | ICD-10-CM

## 2013-07-25 DIAGNOSIS — N951 Menopausal and female climacteric states: Secondary | ICD-10-CM

## 2013-07-25 DIAGNOSIS — R1031 Right lower quadrant pain: Secondary | ICD-10-CM | POA: Insufficient documentation

## 2013-07-25 DIAGNOSIS — G4700 Insomnia, unspecified: Secondary | ICD-10-CM

## 2013-07-25 MED ORDER — CITALOPRAM HYDROBROMIDE 20 MG PO TABS: 20.0000 mg | ORAL_TABLET | Freq: Every day | ORAL | Status: DC

## 2013-07-25 MED ORDER — ZOLPIDEM TARTRATE 10 MG PO TABS: 10.0000 mg | ORAL_TABLET | Freq: Every evening | ORAL | Status: DC | PRN

## 2013-07-25 NOTE — Assessment & Plan Note (Signed)
No response to gabapentin for her menopausal symptoms. She is a smoker, so we cannot use hormone replacement. I am going to add citalopram. She does have some mood symptoms as well, this will knock out the words one stone.

## 2013-07-25 NOTE — Progress Notes (Signed)
  Subjective:    CC: Followup  HPI: Insomnia: Persistent, did not have any response to hydroxyzine, 5 mg of Ambien was ineffective, unfortunately she did not take the 10 mg Ambien prescribed, and threw away.  Cough: Persistent, continues to smoke.  Flashes: Surgical menopause, gabapentin was ineffective, due to smoking she cannot use hormonal replacement.  Past medical history, Surgical history, Family history not pertinant except as noted below, Social history, Allergies, and medications have been entered into the medical record, reviewed, and no changes needed.   Review of Systems: No fevers, chills, night sweats, weight loss, chest pain, or shortness of breath.   Objective:    General: Well Developed, well nourished, and in no acute distress.  Neuro: Alert and oriented x3, extra-ocular muscles intact, sensation grossly intact.  HEENT: Normocephalic, atraumatic, pupils equal round reactive to light, neck supple, no masses, no lymphadenopathy, thyroid nonpalpable.  Skin: Warm and dry, no rashes. Cardiac: Regular rate and rhythm, no murmurs rubs or gallops, no lower extremity edema.  Respiratory: Clear to auscultation bilaterally. Not using accessory muscles, speaking in full sentences.  Impression and Recommendations:

## 2013-07-25 NOTE — Assessment & Plan Note (Signed)
Unfortunately this is persistent, if no better at the next visit we will pull the trigger for pre-and postbronchodilator spirometry. She continues to smoke.

## 2013-07-25 NOTE — Assessment & Plan Note (Signed)
No response to hydroxyzine or 5 mg of Ambien, last visit we switched to 10 mg, she did not take it.

## 2013-07-26 ENCOUNTER — Ambulatory Visit (HOSPITAL_COMMUNITY): Payer: BC Managed Care – PPO

## 2013-07-26 ENCOUNTER — Encounter (HOSPITAL_COMMUNITY)
Admission: RE | Admit: 2013-07-26 | Discharge: 2013-07-26 | Disposition: A | Discharge status: Home / Self Care | Payer: BC Managed Care – PPO | Source: Ambulatory Visit | Attending: Surgery | Admitting: Surgery

## 2013-07-26 DIAGNOSIS — R1031 Right lower quadrant pain: Secondary | ICD-10-CM | POA: Insufficient documentation

## 2013-07-26 MED ORDER — SINCALIDE 5 MCG IJ SOLR
INTRAMUSCULAR | Status: AC
  Filled 2013-07-26: qty 5

## 2013-07-26 MED ORDER — TECHNETIUM TC 99M MEBROFENIN IV KIT
5.0000 | PACK | Freq: Once | INTRAVENOUS | Status: AC | PRN
  Administered 2013-07-26: 5 via INTRAVENOUS

## 2013-07-26 MED ORDER — SINCALIDE 5 MCG IJ SOLR
0.0200 ug/kg | Freq: Once | INTRAMUSCULAR | Status: AC
  Administered 2013-07-26: 1.5 ug via INTRAVENOUS

## 2013-07-26 MED ORDER — STERILE WATER FOR INJECTION IJ SOLN
INTRAMUSCULAR | Status: AC
  Filled 2013-07-26: qty 10

## 2013-08-03 ENCOUNTER — Telehealth (INDEPENDENT_AMBULATORY_CARE_PROVIDER_SITE_OTHER): Payer: Self-pay | Admitting: *Deleted

## 2013-08-03 NOTE — Telephone Encounter (Signed)
Pt left a message on my voicemail inquiring about test results?  Please advise!  Anderson Malta

## 2013-08-04 ENCOUNTER — Other Ambulatory Visit (INDEPENDENT_AMBULATORY_CARE_PROVIDER_SITE_OTHER): Payer: Self-pay | Admitting: Surgery

## 2013-08-04 ENCOUNTER — Telehealth (INDEPENDENT_AMBULATORY_CARE_PROVIDER_SITE_OTHER): Payer: Self-pay

## 2013-08-04 NOTE — Telephone Encounter (Signed)
Message copied by Carlene Coria on Thu Aug 04, 2013  8:44 AM ------      Message from: Joya San      Created: Tue Aug 02, 2013  8:39 AM      Contact: [phone]       She had a Ct done last week and still waiting on results. Please advise ------

## 2013-08-04 NOTE — Telephone Encounter (Signed)
Orders sent to schedulers 

## 2013-08-04 NOTE — Progress Notes (Signed)
Pt still having episodes of RUQ pain after eating. HIDA  EF 43 %.   NO PAIN WITH CCK.  PT UNDERSTANDS THAT SURGERY WOULD BENEFIT HER ONLY ABOUT 50 % OF THE TIME IMPROVING HER SXS.  SHE STILL WISHES TO PROCEED.  The procedure has been discussed with the patient. Operative and non operative treatments have been discussed. Risks of surgery include bleeding, infection,  Common bile duct injury,  Injury to the stomach,liver, colon,small intestine, abdominal wall,  Diaphragm,  Major blood vessels,  And the need for an open procedure.  Other risks include worsening of medical problems, death,  DVT and pulmonary embolism, and cardiovascular events.   Medical options have also been discussed. The patient has been informed of long term expectations of surgery and non surgical options,  The patient agrees to proceed.

## 2013-08-04 NOTE — Telephone Encounter (Signed)
Called pt with HIDA results. Pt would like to go ahead and get GB out. Will talk with Dr Brantley Stage this afternoon about orders.

## 2013-08-15 ENCOUNTER — Ambulatory Visit: Payer: BC Managed Care – PPO | Admitting: Sports Medicine

## 2013-08-16 ENCOUNTER — Ambulatory Visit (INDEPENDENT_AMBULATORY_CARE_PROVIDER_SITE_OTHER): Payer: BC Managed Care – PPO | Admitting: Sports Medicine

## 2013-08-16 ENCOUNTER — Encounter: Payer: Self-pay | Admitting: Sports Medicine

## 2013-08-16 ENCOUNTER — Telehealth: Payer: Self-pay | Admitting: *Deleted

## 2013-08-16 VITALS — BP 115/67 | HR 75 | Ht 65.0 in | Wt 163.0 lb

## 2013-08-16 DIAGNOSIS — G4700 Insomnia, unspecified: Secondary | ICD-10-CM

## 2013-08-16 DIAGNOSIS — M5416 Radiculopathy, lumbar region: Secondary | ICD-10-CM

## 2013-08-16 DIAGNOSIS — R059 Cough, unspecified: Secondary | ICD-10-CM

## 2013-08-16 DIAGNOSIS — F329 Major depressive disorder, single episode, unspecified: Secondary | ICD-10-CM

## 2013-08-16 DIAGNOSIS — M5030 Other cervical disc degeneration, unspecified cervical region: Secondary | ICD-10-CM

## 2013-08-16 DIAGNOSIS — R05 Cough: Secondary | ICD-10-CM

## 2013-08-16 DIAGNOSIS — F3289 Other specified depressive episodes: Secondary | ICD-10-CM

## 2013-08-16 DIAGNOSIS — IMO0002 Reserved for concepts with insufficient information to code with codable children: Secondary | ICD-10-CM

## 2013-08-16 DIAGNOSIS — N951 Menopausal and female climacteric states: Secondary | ICD-10-CM

## 2013-08-16 MED ORDER — ZOLPIDEM TARTRATE ER 6.25 MG PO TBCR: 6.2500 mg | EXTENDED_RELEASE_TABLET | Freq: Every evening | ORAL | Status: DC | PRN

## 2013-08-16 NOTE — Progress Notes (Signed)
  Subjective:    CC: Followup  HPI: Cough: Improved slightly but still persistent. She continues to smoke but has an appointment with a hypnotist to try and quit smoking. She has failed bupropion, Chantix, nicotine replacement.  Depression: Has been on citalopram now for a week or 2, no improvement.  Flashes : resolved with citalopram.  Insomnia: 5 mg of Ambien was too little, 10 mg is too much. Ended up a little groggy through the day.  Past medical history, Surgical history, Family history not pertinant except as noted below, Social history, Allergies, and medications have been entered into the medical record, reviewed, and no changes needed.   Review of Systems: No fevers, chills, night sweats, weight loss, chest pain, or shortness of breath.   Objective:    General: Well Developed, well nourished, and in no acute distress.  Neuro: Alert and oriented x3, extra-ocular muscles intact, sensation grossly intact.  HEENT: Normocephalic, atraumatic, pupils equal round reactive to light, neck supple, no masses, no lymphadenopathy, thyroid nonpalpable.  Skin: Warm and dry, no rashes. Cardiac: Regular rate and rhythm, no murmurs rubs or gallops, no lower extremity edema.  Respiratory: Clear to auscultation bilaterally. Not using accessory muscles, speaking in full sentences.  Impression and Recommendations:

## 2013-08-16 NOTE — Assessment & Plan Note (Signed)
Continues to try to quit smoking. She is going to see a hypnotist. I would like her to come back for pre-and post bronchodilator spirometry.

## 2013-08-16 NOTE — Assessment & Plan Note (Signed)
Ambien 5 mg once every week, 10 mg to strong, switching to continuous release 6.25 mg.

## 2013-08-16 NOTE — Assessment & Plan Note (Signed)
Vasomotor instability has resolved on citalopram.

## 2013-08-16 NOTE — Assessment & Plan Note (Signed)
Having a recurrence of pain after a two-level fusion by Dr. Lynann Bologna. She will return to him for this.

## 2013-08-16 NOTE — Assessment & Plan Note (Signed)
No improvement on mood just yet, she still needs to stay on citalopram.

## 2013-08-16 NOTE — Telephone Encounter (Signed)
No PA required for MRI Lumbar w/o contrast.  Misty Ahmad, LPN  

## 2013-08-16 NOTE — Assessment & Plan Note (Signed)
No response now after a month of physical therapy. Proceed with MRI for interventional injection planning. Return to see me to go over results of the MRI.

## 2013-08-17 ENCOUNTER — Ambulatory Visit (INDEPENDENT_AMBULATORY_CARE_PROVIDER_SITE_OTHER): Payer: BC Managed Care – PPO

## 2013-08-17 DIAGNOSIS — M47817 Spondylosis without myelopathy or radiculopathy, lumbosacral region: Secondary | ICD-10-CM

## 2013-08-17 DIAGNOSIS — M5126 Other intervertebral disc displacement, lumbar region: Secondary | ICD-10-CM

## 2013-08-17 DIAGNOSIS — M5416 Radiculopathy, lumbar region: Secondary | ICD-10-CM

## 2013-08-24 ENCOUNTER — Ambulatory Visit (INDEPENDENT_AMBULATORY_CARE_PROVIDER_SITE_OTHER): Payer: BC Managed Care – PPO | Admitting: Sports Medicine

## 2013-08-24 ENCOUNTER — Encounter: Payer: Self-pay | Admitting: Sports Medicine

## 2013-08-24 VITALS — BP 113/71 | HR 96 | Ht 65.0 in | Wt 163.0 lb

## 2013-08-24 DIAGNOSIS — J42 Unspecified chronic bronchitis: Secondary | ICD-10-CM

## 2013-08-24 DIAGNOSIS — R05 Cough: Secondary | ICD-10-CM

## 2013-08-24 DIAGNOSIS — R059 Cough, unspecified: Secondary | ICD-10-CM

## 2013-08-24 DIAGNOSIS — M5416 Radiculopathy, lumbar region: Secondary | ICD-10-CM

## 2013-08-24 DIAGNOSIS — IMO0002 Reserved for concepts with insufficient information to code with codable children: Secondary | ICD-10-CM

## 2013-08-24 DIAGNOSIS — E669 Obesity, unspecified: Secondary | ICD-10-CM

## 2013-08-24 DIAGNOSIS — G4700 Insomnia, unspecified: Secondary | ICD-10-CM

## 2013-08-24 MED ORDER — FLUTICASONE-SALMETEROL 250-50 MCG/DOSE IN AEPB: 1.0000 | INHALATION_SPRAY | Freq: Two times a day (BID) | RESPIRATORY_TRACT | Status: DC

## 2013-08-24 MED ORDER — PHENTERMINE HCL 37.5 MG PO CAPS: 37.5000 mg | ORAL_CAPSULE | ORAL | Status: DC

## 2013-08-24 MED ORDER — ESZOPICLONE 2 MG PO TABS: 2.0000 mg | ORAL_TABLET | Freq: Every evening | ORAL | Status: DC | PRN

## 2013-08-24 MED ORDER — ALBUTEROL SULFATE (2.5 MG/3ML) 0.083% IN NEBU
2.5000 mg | INHALATION_SOLUTION | Freq: Once | RESPIRATORY_TRACT | Status: AC
  Administered 2013-08-24: 2.5 mg via RESPIRATORY_TRACT

## 2013-08-24 NOTE — Assessment & Plan Note (Signed)
Had some somnambulism on Ambien CR. Switching to Lunesta.

## 2013-08-24 NOTE — Assessment & Plan Note (Signed)
Starting phentermine, return monthly for weight checks and refills, referral to nutrition

## 2013-08-24 NOTE — Assessment & Plan Note (Signed)
Starting low-dose Advair. Return in a month.

## 2013-08-24 NOTE — Progress Notes (Signed)
  Subjective:    CC: Follow up  HPI: Cough: Persistent, still smokes, has not yet seen the hypnotist. Here for spirometry.  Right lumbar radiculitis: Persistent symptoms don't right leg in an L5 versus an S1 distribution, MRI results we dictated below, she has already failed physical therapy, steroids, and since.  Obesity: desires to start weight loss treatment.  Insomnia: Experienced somnambulism with Ambien. Desires to try Lunesta. Trazodone has not worked, antihistamines have been ineffective. We've already discussed sleep hygiene.  Past medical history, Surgical history, Family history not pertinant except as noted below, Social history, Allergies, and medications have been entered into the medical record, reviewed, and no changes needed.   Review of Systems: No fevers, chills, night sweats, weight loss, chest pain, or shortness of breath.   Objective:    General: Well Developed, well nourished, and in no acute distress.  Neuro: Alert and oriented x3, extra-ocular muscles intact, sensation grossly intact.  HEENT: Normocephalic, atraumatic, pupils equal round reactive to light, neck supple, no masses, no lymphadenopathy, thyroid nonpalpable.  Skin: Warm and dry, no rashes. Cardiac: Regular rate and rhythm, no murmurs rubs or gallops, no lower extremity edema.  Respiratory: Clear to auscultation bilaterally. Not using accessory muscles, speaking in full sentences.  Impression and Recommendations:

## 2013-08-24 NOTE — Assessment & Plan Note (Signed)
MRI does show multilevel protrusions, worse at the right L5-S1 level with multifactorial foraminal stenosis from both at L5-S1 disc protrusion and facet hypertrophy. She is failed conservative measures including physical therapy, NSAIDs, steroids. At this point we are going to proceed with a right-sided L5-S1 transforaminal epidural.

## 2013-08-31 NOTE — Pre-Procedure Instructions (Signed)
Joanne [name]  08/31/2013   Your procedure is scheduled on:  Tues, June 2 @ 9:15 AM  Report to [facility]  at 7:15 AM.  Call this number if you have problems the morning of surgery: [phone]   Remember:   Do not eat food or drink liquids after midnight.   Take these medicines the morning of surgery with A SIP OF WATER: Celexa(Citalopram),Advair(Fluticasone)<Bring Your Inhaler With You>,Pain Pill(if needed),and Omeprazole(Prilsoec)                Stop taking your Phentermine. No Goody's,BC's,Aleve,Aspirin,Ibuprofen,Fish Oil,or any Herbal Medications   Do not wear jewelry, make-up or nail polish.  Do not wear lotions, powders, or perfumes. You may wear deodorant.  Do not shave 48 hours prior to surgery.   Do not bring valuables to the hospital.  [facility] is not responsible                  for any belongings or valuables.               Contacts, dentures or bridgework may not be worn into surgery.  Leave suitcase in the car. After surgery it may be brought to your room.  For patients admitted to the hospital, discharge time is determined by your                treatment team.               Patients discharged the day of surgery will not be allowed to drive  home.    Special Instructions:  [place] [place] - Preparing for Surgery  Before surgery, you can play an important role.  Because skin is not sterile, your skin needs to be as free of germs as possible.  You can reduce the number of germs on you skin by washing with CHG (chlorahexidine gluconate) soap before surgery.  CHG is an antiseptic cleaner which kills germs and bonds with the skin to continue killing germs even after washing.  Please DO NOT use if you have an allergy to CHG or antibacterial soaps.  If your skin becomes reddened/irritated stop using the CHG and inform your nurse when you arrive at Short Stay.  Do not shave (including legs and underarms) for at least 48 hours prior to the first CHG shower.  You  may shave your face.  Please follow these instructions carefully:   1.  Shower with CHG Soap the night before surgery and the                                morning of Surgery.  2.  If you choose to wash your hair, wash your hair first as usual with your       normal shampoo.  3.  After you shampoo, rinse your hair and body thoroughly to remove the                      Shampoo.  4.  Use CHG as you would any other liquid soap.  You can apply chg directly       to the skin and wash gently with scrungie or a clean washcloth.  5.  Apply the CHG Soap to your body ONLY FROM THE NECK DOWN.        Do not use on open wounds or open sores.  Avoid contact with your eyes,  ears, mouth and genitals (private parts).  Wash genitals (private parts)       with your normal soap.  6.  Wash thoroughly, paying special attention to the area where your surgery        will be performed.  7.  Thoroughly rinse your body with warm water from the neck down.  8.  DO NOT shower/wash with your normal soap after using and rinsing off       the CHG Soap.  9.  Pat yourself dry with a clean towel.            10.  Wear clean pajamas.            11.  Place clean sheets on your bed the night of your first shower and do not        sleep with pets.  Day of Surgery  Do not apply any lotions/deoderants the morning of surgery.  Please wear clean clothes to the hospital/surgery center.     Please read over the following fact sheets that you were given: Pain Booklet, Coughing and Deep Breathing and Surgical Site Infection Prevention

## 2013-09-01 ENCOUNTER — Encounter (HOSPITAL_COMMUNITY): Payer: Self-pay

## 2013-09-01 ENCOUNTER — Encounter (HOSPITAL_COMMUNITY)
Admission: RE | Admit: 2013-09-01 | Discharge: 2013-09-01 | Disposition: A | Discharge status: Home / Self Care | Payer: BC Managed Care – PPO | Source: Ambulatory Visit | Attending: Surgery | Admitting: Surgery

## 2013-09-01 DIAGNOSIS — Z01812 Encounter for preprocedural laboratory examination: Secondary | ICD-10-CM | POA: Insufficient documentation

## 2013-09-01 LAB — CBC WITH DIFFERENTIAL/PLATELET
Basophils Absolute: 0 10*3/uL (ref 0.0–0.1)
Basophils Relative: 0 % (ref 0–1)
Eosinophils Absolute: 0.1 10*3/uL (ref 0.0–0.7)
Eosinophils Relative: 2 % (ref 0–5)
HEMATOCRIT: 40.7 % (ref 36.0–46.0)
Hemoglobin: 14.1 g/dL (ref 12.0–15.0)
LYMPHS PCT: 29 % (ref 12–46)
Lymphs Abs: 1.9 10*3/uL (ref 0.7–4.0)
MCH: 32.5 pg (ref 26.0–34.0)
MCHC: 34.6 g/dL (ref 30.0–36.0)
MCV: 93.8 fL (ref 78.0–100.0)
Monocytes Absolute: 0.5 10*3/uL (ref 0.1–1.0)
Monocytes Relative: 7 % (ref 3–12)
NEUTROS ABS: 4.2 10*3/uL (ref 1.7–7.7)
Neutrophils Relative %: 62 % (ref 43–77)
PLATELETS: 178 10*3/uL (ref 150–400)
RBC: 4.34 MIL/uL (ref 3.87–5.11)
RDW: 12.5 % (ref 11.5–15.5)
WBC: 6.7 10*3/uL (ref 4.0–10.5)

## 2013-09-01 LAB — COMPREHENSIVE METABOLIC PANEL
ALK PHOS: 88 U/L (ref 39–117)
ALT: 11 U/L (ref 0–35)
AST: 15 U/L (ref 0–37)
Albumin: 4.1 g/dL (ref 3.5–5.2)
BILIRUBIN TOTAL: 0.3 mg/dL (ref 0.3–1.2)
BUN: 7 mg/dL (ref 6–23)
CHLORIDE: 101 meq/L (ref 96–112)
CO2: 27 meq/L (ref 19–32)
Calcium: 9.4 mg/dL (ref 8.4–10.5)
Creatinine, Ser: 0.52 mg/dL (ref 0.50–1.10)
GFR calc Af Amer: >90 mL/min (ref >90)
Glucose, Bld: 103 mg/dL — ABNORMAL HIGH (ref 70–99)
POTASSIUM: 3.7 meq/L (ref 3.7–5.3)
SODIUM: 140 meq/L (ref 137–147)
Total Protein: 7.6 g/dL (ref 6.0–8.3)

## 2013-09-02 ENCOUNTER — Telehealth (INDEPENDENT_AMBULATORY_CARE_PROVIDER_SITE_OTHER): Payer: Self-pay

## 2013-09-02 NOTE — Telephone Encounter (Signed)
LMOM> moved pts po appt time to 9:20 the same day.

## 2013-09-05 MED ORDER — CHLORHEXIDINE GLUCONATE 4 % EX LIQD
1.0000 "application " | Freq: Once | CUTANEOUS | Status: DC
  Filled 2013-09-05: qty 15

## 2013-09-05 MED ORDER — CEFAZOLIN SODIUM-DEXTROSE 2-3 GM-% IV SOLR
2.0000 g | INTRAVENOUS | Status: AC
  Administered 2013-09-06: 2 g via INTRAVENOUS
  Filled 2013-09-05: qty 50

## 2013-09-06 ENCOUNTER — Encounter (HOSPITAL_COMMUNITY)
Admission: RE | Disposition: A | Discharge status: Home / Self Care | Payer: Self-pay | Source: Ambulatory Visit | Attending: Surgery

## 2013-09-06 ENCOUNTER — Encounter (HOSPITAL_COMMUNITY): Payer: Self-pay | Admitting: *Deleted

## 2013-09-06 ENCOUNTER — Ambulatory Visit (HOSPITAL_COMMUNITY): Payer: BC Managed Care – PPO

## 2013-09-06 ENCOUNTER — Ambulatory Visit (HOSPITAL_COMMUNITY): Payer: BC Managed Care – PPO | Admitting: Certified Registered Nurse Anesthetist

## 2013-09-06 ENCOUNTER — Ambulatory Visit (HOSPITAL_COMMUNITY)
Admission: RE | Admit: 2013-09-06 | Discharge: 2013-09-06 | Disposition: A | Discharge status: Home / Self Care | Payer: BC Managed Care – PPO | Source: Ambulatory Visit | Attending: Surgery | Admitting: Surgery

## 2013-09-06 ENCOUNTER — Encounter (HOSPITAL_COMMUNITY): Payer: BC Managed Care – PPO | Admitting: Certified Registered Nurse Anesthetist

## 2013-09-06 DIAGNOSIS — N879 Dysplasia of cervix uteri, unspecified: Secondary | ICD-10-CM

## 2013-09-06 DIAGNOSIS — N951 Menopausal and female climacteric states: Secondary | ICD-10-CM

## 2013-09-06 DIAGNOSIS — S63619A Unspecified sprain of unspecified finger, initial encounter: Secondary | ICD-10-CM

## 2013-09-06 DIAGNOSIS — J42 Unspecified chronic bronchitis: Secondary | ICD-10-CM

## 2013-09-06 DIAGNOSIS — M5030 Other cervical disc degeneration, unspecified cervical region: Secondary | ICD-10-CM

## 2013-09-06 DIAGNOSIS — Z79899 Other long term (current) drug therapy: Secondary | ICD-10-CM | POA: Insufficient documentation

## 2013-09-06 DIAGNOSIS — K811 Chronic cholecystitis: Secondary | ICD-10-CM | POA: Insufficient documentation

## 2013-09-06 DIAGNOSIS — F3289 Other specified depressive episodes: Secondary | ICD-10-CM | POA: Insufficient documentation

## 2013-09-06 DIAGNOSIS — K219 Gastro-esophageal reflux disease without esophagitis: Secondary | ICD-10-CM | POA: Insufficient documentation

## 2013-09-06 DIAGNOSIS — F329 Major depressive disorder, single episode, unspecified: Secondary | ICD-10-CM | POA: Insufficient documentation

## 2013-09-06 DIAGNOSIS — R1031 Right lower quadrant pain: Secondary | ICD-10-CM

## 2013-09-06 DIAGNOSIS — M5416 Radiculopathy, lumbar region: Secondary | ICD-10-CM

## 2013-09-06 DIAGNOSIS — E669 Obesity, unspecified: Secondary | ICD-10-CM

## 2013-09-06 DIAGNOSIS — F17200 Nicotine dependence, unspecified, uncomplicated: Secondary | ICD-10-CM | POA: Insufficient documentation

## 2013-09-06 DIAGNOSIS — M129 Arthropathy, unspecified: Secondary | ICD-10-CM | POA: Insufficient documentation

## 2013-09-06 DIAGNOSIS — F411 Generalized anxiety disorder: Secondary | ICD-10-CM | POA: Insufficient documentation

## 2013-09-06 DIAGNOSIS — I872 Venous insufficiency (chronic) (peripheral): Secondary | ICD-10-CM

## 2013-09-06 DIAGNOSIS — M79671 Pain in right foot: Secondary | ICD-10-CM

## 2013-09-06 DIAGNOSIS — G4700 Insomnia, unspecified: Secondary | ICD-10-CM

## 2013-09-06 DIAGNOSIS — M79672 Pain in left foot: Secondary | ICD-10-CM

## 2013-09-06 DIAGNOSIS — R1011 Right upper quadrant pain: Secondary | ICD-10-CM

## 2013-09-06 HISTORY — PX: CHOLECYSTECTOMY: SHX55

## 2013-09-06 SURGERY — LAPAROSCOPIC CHOLECYSTECTOMY WITH INTRAOPERATIVE CHOLANGIOGRAM
Anesthesia: General | Site: Abdomen

## 2013-09-06 MED ORDER — ONDANSETRON HCL 4 MG/2ML IJ SOLN
INTRAMUSCULAR | Status: AC
  Filled 2013-09-06: qty 2

## 2013-09-06 MED ORDER — MIDAZOLAM HCL 5 MG/5ML IJ SOLN
INTRAMUSCULAR | Status: DC | PRN
  Administered 2013-09-06: 2 mg via INTRAVENOUS

## 2013-09-06 MED ORDER — HYDROCODONE-ACETAMINOPHEN 5-325 MG PO TABS: 1.0000 | ORAL_TABLET | Freq: Four times a day (QID) | ORAL | Status: DC | PRN

## 2013-09-06 MED ORDER — FENTANYL CITRATE 0.05 MG/ML IJ SOLN
INTRAMUSCULAR | Status: AC
  Filled 2013-09-06: qty 5

## 2013-09-06 MED ORDER — 0.9 % SODIUM CHLORIDE (POUR BTL) OPTIME
TOPICAL | Status: DC | PRN
  Administered 2013-09-06: 1000 mL

## 2013-09-06 MED ORDER — SODIUM CHLORIDE 0.9 % IV SOLN
INTRAVENOUS | Status: DC | PRN
  Administered 2013-09-06: 10:00:00

## 2013-09-06 MED ORDER — GLYCOPYRROLATE 0.2 MG/ML IJ SOLN
INTRAMUSCULAR | Status: DC | PRN
  Administered 2013-09-06: .6 mg via INTRAVENOUS

## 2013-09-06 MED ORDER — PHENYLEPHRINE 40 MCG/ML (10ML) SYRINGE FOR IV PUSH (FOR BLOOD PRESSURE SUPPORT)
PREFILLED_SYRINGE | INTRAVENOUS | Status: AC
  Filled 2013-09-06: qty 10

## 2013-09-06 MED ORDER — MEPERIDINE HCL 25 MG/ML IJ SOLN
6.2500 mg | INTRAMUSCULAR | Status: DC | PRN
Start: 2013-09-06 — End: 2013-09-06

## 2013-09-06 MED ORDER — NEOSTIGMINE METHYLSULFATE 10 MG/10ML IV SOLN
INTRAVENOUS | Status: DC | PRN
  Administered 2013-09-06: 4 mg via INTRAVENOUS

## 2013-09-06 MED ORDER — MIDAZOLAM HCL 2 MG/2ML IJ SOLN
INTRAMUSCULAR | Status: AC
  Filled 2013-09-06: qty 2

## 2013-09-06 MED ORDER — ONDANSETRON HCL 4 MG/2ML IJ SOLN: 4.0000 mg | Freq: Once | INTRAMUSCULAR | Status: DC | PRN

## 2013-09-06 MED ORDER — OXYCODONE HCL 5 MG PO TABS
ORAL_TABLET | ORAL | Status: DC
Start: 2013-09-06 — End: 2013-09-06
  Filled 2013-09-06: qty 1

## 2013-09-06 MED ORDER — SODIUM CHLORIDE 0.9 % IR SOLN
Status: DC | PRN
  Administered 2013-09-06: 1000 mL

## 2013-09-06 MED ORDER — GLYCOPYRROLATE 0.2 MG/ML IJ SOLN
INTRAMUSCULAR | Status: AC
  Filled 2013-09-06: qty 3

## 2013-09-06 MED ORDER — ONDANSETRON HCL 4 MG/2ML IJ SOLN
INTRAMUSCULAR | Status: DC | PRN
  Administered 2013-09-06: 4 mg via INTRAVENOUS

## 2013-09-06 MED ORDER — LIDOCAINE HCL (CARDIAC) 20 MG/ML IV SOLN
INTRAVENOUS | Status: DC | PRN
  Administered 2013-09-06: 100 mg via INTRAVENOUS

## 2013-09-06 MED ORDER — ROCURONIUM BROMIDE 100 MG/10ML IV SOLN
INTRAVENOUS | Status: DC | PRN
  Administered 2013-09-06: 50 mg via INTRAVENOUS

## 2013-09-06 MED ORDER — PROPOFOL 10 MG/ML IV BOLUS
INTRAVENOUS | Status: DC | PRN
  Administered 2013-09-06: 130 mg via INTRAVENOUS

## 2013-09-06 MED ORDER — OXYCODONE HCL 5 MG PO TABS
5.0000 mg | ORAL_TABLET | Freq: Once | ORAL | Status: AC | PRN
  Administered 2013-09-06: 5 mg via ORAL

## 2013-09-06 MED ORDER — HYDROMORPHONE HCL PF 1 MG/ML IJ SOLN
0.2500 mg | INTRAMUSCULAR | Status: DC | PRN
  Administered 2013-09-06 (×4): 0.5 mg via INTRAVENOUS

## 2013-09-06 MED ORDER — HYDROMORPHONE HCL PF 1 MG/ML IJ SOLN
INTRAMUSCULAR | Status: AC
  Filled 2013-09-06: qty 2

## 2013-09-06 MED ORDER — PROPOFOL 10 MG/ML IV BOLUS
INTRAVENOUS | Status: AC
  Filled 2013-09-06: qty 20

## 2013-09-06 MED ORDER — NEOSTIGMINE METHYLSULFATE 10 MG/10ML IV SOLN
INTRAVENOUS | Status: AC
  Filled 2013-09-06: qty 2

## 2013-09-06 MED ORDER — ROCURONIUM BROMIDE 50 MG/5ML IV SOLN
INTRAVENOUS | Status: AC
  Filled 2013-09-06: qty 1

## 2013-09-06 MED ORDER — FENTANYL CITRATE 0.05 MG/ML IJ SOLN
INTRAMUSCULAR | Status: DC | PRN
  Administered 2013-09-06 (×2): 25 ug via INTRAVENOUS
  Administered 2013-09-06: 100 ug via INTRAVENOUS
  Administered 2013-09-06 (×2): 50 ug via INTRAVENOUS

## 2013-09-06 MED ORDER — BUPIVACAINE-EPINEPHRINE (PF) 0.25% -1:200000 IJ SOLN
INTRAMUSCULAR | Status: AC
  Filled 2013-09-06: qty 30

## 2013-09-06 MED ORDER — OXYCODONE HCL 5 MG/5ML PO SOLN: 5.0000 mg | Freq: Once | ORAL | Status: AC | PRN

## 2013-09-06 MED ORDER — TRAMADOL HCL 50 MG PO TABS: 50.0000 mg | ORAL_TABLET | Freq: Four times a day (QID) | ORAL | Status: DC | PRN

## 2013-09-06 MED ORDER — LACTATED RINGERS IV SOLN
INTRAVENOUS | Status: DC | PRN
  Administered 2013-09-06 (×2): via INTRAVENOUS

## 2013-09-06 MED ORDER — LACTATED RINGERS IV SOLN
INTRAVENOUS | Status: DC
Start: 2013-09-06 — End: 2013-09-06
  Administered 2013-09-06: 08:00:00 via INTRAVENOUS

## 2013-09-06 MED ORDER — LIDOCAINE HCL (CARDIAC) 20 MG/ML IV SOLN
INTRAVENOUS | Status: AC
  Filled 2013-09-06: qty 5

## 2013-09-06 MED ORDER — BUPIVACAINE-EPINEPHRINE 0.25% -1:200000 IJ SOLN
INTRAMUSCULAR | Status: DC | PRN
  Administered 2013-09-06: 30 mL

## 2013-09-06 MED ORDER — PHENYLEPHRINE HCL 10 MG/ML IJ SOLN
INTRAMUSCULAR | Status: DC | PRN
  Administered 2013-09-06: 40 ug via INTRAVENOUS
  Administered 2013-09-06: 80 ug via INTRAVENOUS
  Administered 2013-09-06: 40 ug via INTRAVENOUS

## 2013-09-06 SURGICAL SUPPLY — 44 items
APPLIER CLIP ROT 10 11.4 M/L (STAPLE) ×3
CANISTER SUCTION 2500CC (MISCELLANEOUS) ×3 IMPLANT
CHLORAPREP W/TINT 26ML (MISCELLANEOUS) ×3 IMPLANT
CLIP APPLIE ROT 10 11.4 M/L (STAPLE) ×1 IMPLANT
COVER MAYO STAND STRL (DRAPES) ×3 IMPLANT
COVER SURGICAL LIGHT HANDLE (MISCELLANEOUS) ×3 IMPLANT
DERMABOND ADVANCED (GAUZE/BANDAGES/DRESSINGS) ×2
DERMABOND ADVANCED .7 DNX12 (GAUZE/BANDAGES/DRESSINGS) ×1 IMPLANT
DRAPE C-ARM 42X72 X-RAY (DRAPES) ×3 IMPLANT
DRAPE UTILITY 15X26 W/TAPE STR (DRAPE) ×6 IMPLANT
DRAPE WARM FLUID 44X44 (DRAPE) ×3 IMPLANT
ELECT REM PT RETURN 9FT ADLT (ELECTROSURGICAL) ×3
ELECTRODE REM PT RTRN 9FT ADLT (ELECTROSURGICAL) ×1 IMPLANT
GLOVE BIO SURGEON STRL SZ 6.5 (GLOVE) ×2 IMPLANT
GLOVE BIO SURGEON STRL SZ7 (GLOVE) ×3 IMPLANT
GLOVE BIO SURGEON STRL SZ8 (GLOVE) ×3 IMPLANT
GLOVE BIO SURGEONS STRL SZ 6.5 (GLOVE) ×1
GLOVE BIOGEL PI IND STRL 6.5 (GLOVE) ×1 IMPLANT
GLOVE BIOGEL PI IND STRL 7.0 (GLOVE) ×1 IMPLANT
GLOVE BIOGEL PI IND STRL 8 (GLOVE) ×1 IMPLANT
GLOVE BIOGEL PI INDICATOR 6.5 (GLOVE) ×2
GLOVE BIOGEL PI INDICATOR 7.0 (GLOVE) ×2
GLOVE BIOGEL PI INDICATOR 8 (GLOVE) ×2
GLOVE SURG SS PI 7.0 STRL IVOR (GLOVE) ×3 IMPLANT
GOWN STRL REUS W/ TWL LRG LVL3 (GOWN DISPOSABLE) ×3 IMPLANT
GOWN STRL REUS W/ TWL XL LVL3 (GOWN DISPOSABLE) ×1 IMPLANT
GOWN STRL REUS W/TWL LRG LVL3 (GOWN DISPOSABLE) ×6
GOWN STRL REUS W/TWL XL LVL3 (GOWN DISPOSABLE) ×2
KIT BASIN OR (CUSTOM PROCEDURE TRAY) ×3 IMPLANT
KIT ROOM TURNOVER OR (KITS) ×3 IMPLANT
NS IRRIG 1000ML POUR BTL (IV SOLUTION) ×6 IMPLANT
PAD ARMBOARD 7.5X6 YLW CONV (MISCELLANEOUS) ×3 IMPLANT
POUCH SPECIMEN RETRIEVAL 10MM (ENDOMECHANICALS) ×3 IMPLANT
SCISSORS LAP 5X35 DISP (ENDOMECHANICALS) ×3 IMPLANT
SET CHOLANGIOGRAPH 5 50 .035 (SET/KITS/TRAYS/PACK) ×3 IMPLANT
SET IRRIG TUBING LAPAROSCOPIC (IRRIGATION / IRRIGATOR) ×3 IMPLANT
SLEEVE ENDOPATH XCEL 5M (ENDOMECHANICALS) ×3 IMPLANT
SPECIMEN JAR SMALL (MISCELLANEOUS) ×3 IMPLANT
SUT MNCRL AB 4-0 PS2 18 (SUTURE) ×3 IMPLANT
TOWEL OR 17X26 10 PK STRL BLUE (TOWEL DISPOSABLE) ×3 IMPLANT
TRAY LAPAROSCOPIC (CUSTOM PROCEDURE TRAY) ×3 IMPLANT
TROCAR XCEL BLUNT TIP 100MML (ENDOMECHANICALS) ×3 IMPLANT
TROCAR XCEL NON-BLD 11X100MML (ENDOMECHANICALS) ×3 IMPLANT
TROCAR XCEL NON-BLD 5MMX100MML (ENDOMECHANICALS) ×3 IMPLANT

## 2013-09-06 NOTE — Anesthesia Postprocedure Evaluation (Signed)
Anesthesia Post Note  Patient: Joanne [name]  Procedure(s) Performed: Procedure(s) (LRB): LAPAROSCOPIC CHOLECYSTECTOMY WITH INTRAOPERATIVE CHOLANGIOGRAM (N/A)  Anesthesia type: general  Patient location: PACU  Post pain: Pain level controlled  Post assessment: Patient's Cardiovascular Status Stable  Last Vitals:  Filed Vitals:   09/06/13 1145  BP: 113/73  Pulse: 85  Temp:   Resp: 15    Post vital signs: Reviewed and stable  Level of consciousness: sedated  Complications: No apparent anesthesia complications

## 2013-09-06 NOTE — Interval H&P Note (Signed)
History and Physical Interval Note:  09/06/2013 8:44 AM  Joanne [name]  has presented today for surgery, with the diagnosis of gallstones  The various methods of treatment have been discussed with the patient and family. After consideration of risks, benefits and other options for treatment, the patient has consented to  Procedure(s): LAPAROSCOPIC CHOLECYSTECTOMY WITH INTRAOPERATIVE CHOLANGIOGRAM (N/A) as a surgical intervention .  The patient's history has been reviewed, patient examined, no change in status, stable for surgery.  I have reviewed the patient's chart and labs.  Questions were answered to the patient's satisfaction.     Xaden Kaufman A. Keaun Schnabel

## 2013-09-06 NOTE — Discharge Instructions (Signed)
[facility] ______[facility] °LAPAROSCOPIC SURGERY: POST OP INSTRUCTIONS °Always review your discharge instruction sheet given to you by the facility where your surgery was performed. °IF YOU HAVE DISABILITY OR FAMILY LEAVE FORMS, YOU MUST BRING THEM TO THE OFFICE FOR PROCESSING.   °DO NOT GIVE THEM TO YOUR DOCTOR. ° °1. A prescription for pain medication may be given to you upon discharge.  Take your pain medication as prescribed, if needed.  If narcotic pain medicine is not needed, then you may take acetaminophen (Tylenol) or ibuprofen (Advil) as needed. °2. Take your usually prescribed medications unless otherwise directed. °3. If you need a refill on your pain medication, please contact your pharmacy.  They will contact our office to request authorization. Prescriptions will not be filled after 5pm or on week-ends. °4. You should follow a light diet the first few days after arrival home, such as soup and crackers, etc.  Be sure to include lots of fluids daily. °5. Most patients will experience some swelling and bruising in the area of the incisions.  Ice packs will help.  Swelling and bruising can take several days to resolve.  °6. It is common to experience some constipation if taking pain medication after surgery.  Increasing fluid intake and taking a stool softener (such as Colace) will usually help or prevent this problem from occurring.  A mild laxative (Milk of Magnesia or Miralax) should be taken according to package instructions if there are no bowel movements after 48 hours. °7. Unless discharge instructions indicate otherwise, you may remove your bandages 24-48 hours after surgery, and you may shower at that time.  You may have steri-strips (small skin tapes) in place directly over the incision.  These strips should be left on the skin for 7-10 days.  If your surgeon used skin glue on the incision, you may shower in 24 hours.  The glue will flake off over the next 2-3 weeks.  Any sutures or  staples will be removed at the office during your follow-up visit. °8. ACTIVITIES:  You may resume regular (light) daily activities beginning the next day--such as daily self-care, walking, climbing stairs--gradually increasing activities as tolerated.  You may have sexual intercourse when it is comfortable.  Refrain from any heavy lifting or straining until approved by your doctor. °a. You may drive when you are no longer taking prescription pain medication, you can comfortably wear a seatbelt, and you can safely maneuver your car and apply brakes. °b. RETURN TO WORK:  __________________________________________________________ °9. You should see your doctor in the office for a follow-up appointment approximately 2-3 weeks after your surgery.  Make sure that you call for this appointment within a day or two after you arrive home to insure a convenient appointment time. °10. OTHER INSTRUCTIONS: __________________________________________________________________________________________________________________________ __________________________________________________________________________________________________________________________ °WHEN TO CALL YOUR DOCTOR: °1. Fever over 101.0 °2. Inability to urinate °3. Continued bleeding from incision. °4. Increased pain, redness, or drainage from the incision. °5. Increasing abdominal pain ° °The clinic staff is available to answer your questions during regular business hours.  Please don’t hesitate to call and ask to speak to one of the nurses for clinical concerns.  If you have a medical emergency, go to the nearest emergency room or call 911.  A surgeon from [facility] is always on call at the hospital. °[address], [address], [place], [place]  [address] ? [address] [address], [place], [place]   [address] °[phone] ? [phone] ? FAX [phone] °Web site:   [email or web address] °

## 2013-09-06 NOTE — Anesthesia Preprocedure Evaluation (Addendum)
Anesthesia Evaluation  Patient identified by MRN, date of birth, ID band Patient awake    Reviewed: Allergy & Precautions, H&P , NPO status , Patient's Chart, lab work & pertinent test results  Airway Mallampati: II TM Distance: >3 FB Neck ROM: Full    Dental  (+) Dental Advisory Given, Edentulous Upper, Edentulous Lower   Pulmonary shortness of breath, Current Smoker,          Cardiovascular     Neuro/Psych  Headaches, PSYCHIATRIC DISORDERS Anxiety Depression    GI/Hepatic GERD-  Controlled,  Endo/Other    Renal/GU      Musculoskeletal  (+) Arthritis -,   Abdominal   Peds  Hematology   Anesthesia Other Findings   Reproductive/Obstetrics                         Anesthesia Physical Anesthesia Plan  ASA: II  Anesthesia Plan: General   Post-op Pain Management:    Induction: Intravenous  Airway Management Planned: Oral ETT  Additional Equipment:   Intra-op Plan:   Post-operative Plan: Extubation in OR  Informed Consent: I have reviewed the patients History and Physical, chart, labs and discussed the procedure including the risks, benefits and alternatives for the proposed anesthesia with the patient or authorized representative who has indicated his/her understanding and acceptance.   Dental advisory given  Plan Discussed with: CRNA and Surgeon  Anesthesia Plan Comments:        Anesthesia Quick Evaluation

## 2013-09-06 NOTE — Transfer of Care (Signed)
Immediate Anesthesia Transfer of Care Note  Patient: Joanne [name]  Procedure(s) Performed: Procedure(s): LAPAROSCOPIC CHOLECYSTECTOMY WITH INTRAOPERATIVE CHOLANGIOGRAM (N/A)  Patient Location: PACU  Anesthesia Type:General  Level of Consciousness: awake and alert   Airway & Oxygen Therapy: Patient Spontanous Breathing and Patient connected to nasal cannula oxygen  Post-op Assessment: Report given to PACU RN, Post -op Vital signs reviewed and stable and Patient moving all extremities X 4  Post vital signs: Reviewed and stable  Complications: No apparent anesthesia complications

## 2013-09-06 NOTE — Op Note (Signed)
Laparoscopic Cholecystectomy with IOC Procedure Note  Indications: This patient presents with symptomatic gallbladder disease and will undergo laparoscopic cholecystectomy.The procedure has been discussed with the patient. Operative and non operative treatments have been discussed. Risks of surgery include bleeding, infection,  Common bile duct injury,  Injury to the stomach,liver, colon,small intestine, abdominal wall,  Diaphragm,  Major blood vessels,  And the need for an open procedure.  Other risks include worsening of medical problems, death,  DVT and pulmonary embolism, and cardiovascular events.   Medical options have also been discussed. The patient has been informed of long term expectations of surgery and non surgical options,  The patient agrees to proceed.  Work up negative.  Likely hood surgery will improve symptoms is 50 %.  Pt aware of this.   Pre-operative Diagnosis: Abdominal pain, right upper quadrant  Post-operative Diagnosis: Same  Surgeon: Marcello Moores A. Booker Bhatnagar   Assistants: OR staff  Anesthesia: General endotracheal anesthesia and Local anesthesia 0.25.% bupivacaine, with epinephrine  ASA Class: 2  Procedure Details  The patient was seen again in the Holding Room. The risks, benefits, complications, treatment options, and expected outcomes were discussed with the patient. The possibilities of reaction to medication, pulmonary aspiration, perforation of viscus, bleeding, recurrent infection, finding a normal gallbladder, the need for additional procedures, failure to diagnose a condition, the possible need to convert to an open procedure, and creating a complication requiring transfusion or operation were discussed with the patient. The patient and/or family concurred with the proposed plan, giving informed consent. The site of surgery properly noted/marked. The patient was taken to Operating Room, identified as [name] and the procedure verified as Laparoscopic  Cholecystectomy with Intraoperative Cholangiograms. A Time Out was held and the above information confirmed.  Prior to the induction of general anesthesia, antibiotic prophylaxis was administered. General endotracheal anesthesia was then administered and tolerated well. After the induction, the abdomen was prepped in the usual sterile fashion. The patient was positioned in the supine position with the left arm comfortably tucked, along with some reverse Trendelenburg.  Local anesthetic agent was injected into the skin near the umbilicus and an incision made. The midline fascia was incised and the Hasson technique was used to introduce a 12 mm port under direct vision. It was secured with a figure of eight Vicryl suture placed in the usual fashion. Pneumoperitoneum was then created with CO2 and tolerated well without any adverse changes in the patient's vital signs. Additional trocars were introduced under direct vision with an 11 mm trocar in the epigastrium and 2 5 mm trocars in the right upper quadrant. All skin incisions were infiltrated with a local anesthetic agent before making the incision and placing the trocars.   The gallbladder was identified, the fundus grasped and retracted cephalad. Adhesions were lysed bluntly and with the electrocautery where indicated, taking care not to injure any adjacent organs or viscus. The infundibulum was grasped and retracted laterally, exposing the peritoneum overlying the triangle of Calot. This was then divided and exposed in a blunt fashion. The cystic duct was clearly identified and bluntly dissected circumferentially. The junctions of the gallbladder, cystic duct and common bile duct were clearly identified prior to the division of any linear structure.   An incision was made in the cystic duct and the cholangiogram catheter introduced. The catheter was secured using an endoclip. The study showed no stones and good visualization of the distal and proximal  biliary tree. The catheter was then removed.   The  cystic duct was then  ligated with surgical clips  on the patient side and  clipped on the gallbladder side and divided. The cystic artery was identified, dissected free, ligated with clips and divided as well. Posterior cystic artery clipped and divided.  The gallbladder was dissected from the liver bed in retrograde fashion with the electrocautery. The gallbladder was removed. The liver bed was irrigated and inspected. Hemostasis was achieved with the electrocautery. Copious irrigation was utilized and was repeatedly aspirated until clear all particulate matter. Hemostasis was achieved with no signs  Of bleeding or bile leakage.  Pneumoperitoneum was completely reduced after viewing removal of the trocars under direct vision. The wound was thoroughly irrigated and the fascia was then closed with a figure of eight suture; the skin was then closed with 4 O monocryl and a sterile dressing of Dermabond  was applied.  Instrument, sponge, and needle counts were correct at closure and at the conclusion of the case.   Findings: NL IOC and normal appearing gallbladder.   Estimated Blood Loss: Minimal         Drains: none         Total IV Fluids: 400 mL         Specimens: Gallbladder           Complications: None; patient tolerated the procedure well.         Disposition: PACU - hemodynamically stable.         Condition: stable

## 2013-09-06 NOTE — Anesthesia Procedure Notes (Signed)
Procedure Name: Intubation Date/Time: 09/06/2013 9:27 AM Performed by: Jacob Moores Pre-anesthesia Checklist: Patient identified, Emergency Drugs available, Suction available and Patient being monitored Patient Re-evaluated:Patient Re-evaluated prior to inductionOxygen Delivery Method: Circle system utilized Preoxygenation: Pre-oxygenation with 100% oxygen Intubation Type: IV induction Ventilation: Mask ventilation without difficulty Laryngoscope Size: Miller and 2 Grade View: Grade I Tube type: Oral Tube size: 7.5 mm Number of attempts: 1 Airway Equipment and Method: Stylet Placement Confirmation: ETT inserted through vocal cords under direct vision,  positive ETCO2 and breath sounds checked- equal and bilateral Secured at: 21 cm Tube secured with: Tape Dental Injury: Teeth and Oropharynx as per pre-operative assessment

## 2013-09-06 NOTE — H&P (Addendum)
Joanne [name]  07/04/2013 10:10 AM   Office Visit  MRN:  [id number]   Description: 43 year old female  Provider: Joyice Faster. Joanne Peplinski, MD  Department: [facility]         Guarantor Account: Mika, Griffitts ([id number])      Relation to Patient: Account Type Service Area      Self Personal/Family [place] for This Account     Coverage ID Payor Plan Insurance ID      [id number] [facility] [employer] [place] [employer] PPO OUT OF STATE [id number]              Guarantor Account: Raffaela, Ladley ([id number])      Relation to Patient: Account Type Service Area      Self Personal/Family [facility]                 Guarantor Account: Jaylinn, Hellenbrand ([id number])      Relation to Patient: Account Type Service Area      Self Personal/Family [place] for This Account     Coverage ID Payor Plan Insurance ID      [id number] BLUE [employer]UE [employer] BC[employer] PPO OUT OF STATE [phone]                Diagnoses      RLQ abdominal pain    -  Primary      789.03             Reason for Visit      eval gallbladder      new pt               Current Vitals Most recent update: 07/04/2013 10:23 AM by Vale Haven, CMA      BP Pulse Temp(Src) Resp Ht Wt      118/80 75 97.8 F (36.6 C) (Oral) 16 5\' 5"  (1.651 m) 165 lb 12.8 oz (75.206 kg)      BMI                27.59 kg/m2                        Progress Notes      Alejandrina Raimer A. Maudry Zeidan, MD at 07/04/2013 10:45 AM      Status: Signed            Patient ID: Joanne [name], female   DOB: Jul 10, 1970, 43 y.o.   MRN: [id number]    Chief Complaint   Patient presents with   .  eval gallbladder       new pt        HPI Joanne [name] is a 43 y.o. female.  Pt sent at the request of Dr  HPI [name], MD for right upper quadrant abdominal pain.  Has had intermittent pain on and off for 21 years.  Location RUQ  and after eating.  Lasts 30 minutes and goes away on its own.  Fatty greasy food the worse.  Heartburn as well.  Minimal nausea.       Past Medical History   Diagnosis  Date   .  Tobacco abuse     .  Irregular heartbeat  pt. states that she has been told that she has an irreg. heartbeat on occas.   .  Anxiety         panic attacks - several yrs. ago, pt. reports that she is claustrophobic      .  Shortness of breath     .  GERD (gastroesophageal reflux disease)     .  Headache(784.0)     .  Cancer         hysterectomy for preCA of the cervix     .  Anemia         h/o of Bld. Transfusion post C/Section   .  Arthritis         shoulders- ? bursitis          Past Surgical History   Procedure  Laterality  Date   .  Tonsillectomy       .  Abdominal hysterectomy           bowel repair /w Davinci Robot procedure    .  Cesarean section    1994       tubal ligation    .  Anterior cervical decomp/discectomy fusion  N/A  01/06/2013       Procedure: ANTERIOR CERVICAL DECOMPRESSION/DISCECTOMY FUSION 2 LEVELS;  Surgeon: Sinclair Ship, MD;  Location: [facility];  Service: Orthopedics;  Laterality: N/A;  Anterior cervical decompression fusion, cervical 5-6, cervical 6-7 with instrumentation and allograft         Family History   Problem  Relation  Age of Onset   .  Depression  Mother     .  Heart attack  Father     .  Hypertension  Father     .  Cancer  Sister     .  Alcohol abuse  Brother     .  Diabetes  Maternal Grandmother     .  Cancer  Sister          Social History History   Substance Use Topics   .  Smoking status:  Current Every Day Smoker -- 0.50 packs/day   .  Smokeless tobacco:  Not on file   .  Alcohol Use:  No         Allergies   Allergen  Reactions   .  Morphine And Related  Anaphylaxis         Current Outpatient Prescriptions   Medication  Sig  Dispense  Refill   .  AMBULATORY NON FORMULARY MEDICATION  Knee-high graduated compression  stockings. Apply to lower extremities.   1 each   0   .  cyclobenzaprine (FLEXERIL) 10 MG tablet  One half tab PO qHS, then increase gradually to one tab TID.   30 tablet   0   .  HYDROcodone-acetaminophen (NORCO) 5-325 MG per tablet  Take 1-2 tablets by mouth every 4 (four) hours as needed.   60 tablet   0   .  hydrOXYzine (ATARAX/VISTARIL) 50 MG tablet  Take 1 tablet (50 mg total) by mouth at bedtime.   90 tablet   3   .  omeprazole (PRILOSEC) 40 MG capsule  Take 40 mg by mouth daily.         Marland Kitchen  zolpidem (AMBIEN) 10 MG tablet  Take 1 tablet (10 mg total) by mouth at bedtime as needed for sleep.   30 tablet   0       No current facility-administered medications for this visit.  Review of Systems Review of Systems  Constitutional: Negative for fever, chills and unexpected weight change.  HENT: Negative for congestion, hearing loss, sore throat, trouble swallowing and voice change.   Eyes: Negative for visual disturbance.  Respiratory: Negative for cough and wheezing.   Cardiovascular: Negative for chest pain, palpitations and leg swelling.  Gastrointestinal: Positive for abdominal pain. Negative for nausea, vomiting, diarrhea, constipation, blood in stool, abdominal distention and anal bleeding.  Genitourinary: Negative for hematuria, vaginal bleeding and difficulty urinating.  Musculoskeletal: Negative for arthralgias.  Skin: Negative for rash and wound.  Neurological: Negative for seizures, syncope and headaches.  Hematological: Negative for adenopathy. Does not bruise/bleed easily.  Psychiatric/Behavioral: Negative for confusion.      Blood pressure 118/80, pulse 75, temperature 97.8 F (36.6 C), temperature source Oral, resp. rate 16, height 5\' 5"  (1.651 m), weight 165 lb 12.8 oz (75.206 kg).   Physical Exam Physical Exam  Constitutional: She appears well-developed and well-nourished.  HENT:   Head: Normocephalic and atraumatic.  Eyes: EOM are normal. Pupils are  equal, round, and reactive to light.  Neck: Normal range of motion. Neck supple.  Cardiovascular: Normal rate and regular rhythm.   Pulmonary/Chest: Effort normal and breath sounds normal.  Abdominal: Soft. Bowel sounds are normal. She exhibits no distension and no mass. There is no tenderness. There is no rebound and no guarding.        Data Reviewed none   Assessment    Right upper quadrant abdominal pain     Plan    Need U/S to evaluate for gallstones.  If positive,  Would Benefits from laparoscopic cholecystectomy and cholangiogram. If negative, she will require a HIDA study. Further recommendations after workup complete. Information about gallbladder disease given since this would be the most likely diagnosis.          Dinnis Rog A.      Pt still having episodes of RUQ pain after eating. HIDA EF 43 %. NO PAIN WITH CCK. PT UNDERSTANDS THAT SURGERY WOULD BENEFIT HER ONLY ABOUT 50 % OF THE TIME IMPROVING HER SXS. SHE STILL WISHES TO PROCEED. The procedure has been discussed with the patient. Operative and non operative treatments have been discussed. Risks of surgery include bleeding, infection, Common bile duct injury, Injury to the stomach,liver, colon,small intestine, abdominal wall, Diaphragm, Major blood vessels, And the need for an open procedure. Other risks include worsening of medical problems, death, DVT and pulmonary embolism, and cardiovascular events. Medical options have also been discussed. The patient has been informed of long term expectations of surgery and non surgical options, The patient agrees to proceed.

## 2013-09-09 ENCOUNTER — Encounter (HOSPITAL_COMMUNITY): Payer: Self-pay | Admitting: Surgery

## 2013-09-13 ENCOUNTER — Other Ambulatory Visit (INDEPENDENT_AMBULATORY_CARE_PROVIDER_SITE_OTHER): Payer: Self-pay

## 2013-09-13 ENCOUNTER — Telehealth (INDEPENDENT_AMBULATORY_CARE_PROVIDER_SITE_OTHER): Payer: Self-pay

## 2013-09-13 DIAGNOSIS — Z9049 Acquired absence of other specified parts of digestive tract: Secondary | ICD-10-CM

## 2013-09-13 DIAGNOSIS — B379 Candidiasis, unspecified: Secondary | ICD-10-CM

## 2013-09-13 MED ORDER — FLUCONAZOLE 100 MG PO TABS: ORAL_TABLET | ORAL | Status: DC

## 2013-09-13 NOTE — Telephone Encounter (Signed)
Sure Diflucan 100 mg tabs #6 Take 2 tabs then 1 daily until gone

## 2013-09-13 NOTE — Telephone Encounter (Signed)
Informed pt that we have called in Diflucan 100mg   to her pharmacy. Pt verbalized understanding

## 2013-09-13 NOTE — Telephone Encounter (Signed)
Pt s/p lap chole with Dr Brantley Stage on 09/06/13. Pt states that she has been taking abx and she has now gotten a yeast infection. Pt would like to see if she can get a Rx for Diflucan called into her pharmacy.Informed pt that I would send Dr Brantley Stage a message and if he approves this we will give her a call back and let her know it was called in to her pharmacy. Pt verbalized understanding and agrees with POC.

## 2013-09-21 ENCOUNTER — Ambulatory Visit: Payer: BC Managed Care – PPO | Admitting: Sports Medicine

## 2013-09-22 ENCOUNTER — Ambulatory Visit (INDEPENDENT_AMBULATORY_CARE_PROVIDER_SITE_OTHER): Payer: BC Managed Care – PPO | Admitting: Surgery

## 2013-09-22 ENCOUNTER — Encounter (INDEPENDENT_AMBULATORY_CARE_PROVIDER_SITE_OTHER): Payer: Self-pay | Admitting: Surgery

## 2013-09-22 ENCOUNTER — Encounter: Payer: Self-pay | Admitting: Sports Medicine

## 2013-09-22 ENCOUNTER — Ambulatory Visit (INDEPENDENT_AMBULATORY_CARE_PROVIDER_SITE_OTHER): Payer: BC Managed Care – PPO | Admitting: Sports Medicine

## 2013-09-22 VITALS — BP 118/65 | HR 67 | Temp 97.8°F | Resp 14 | Ht 65.0 in | Wt 165.4 lb

## 2013-09-22 VITALS — BP 101/65 | HR 73 | Ht 65.0 in | Wt 166.0 lb

## 2013-09-22 DIAGNOSIS — K219 Gastro-esophageal reflux disease without esophagitis: Secondary | ICD-10-CM

## 2013-09-22 DIAGNOSIS — IMO0002 Reserved for concepts with insufficient information to code with codable children: Secondary | ICD-10-CM

## 2013-09-22 DIAGNOSIS — G4700 Insomnia, unspecified: Secondary | ICD-10-CM

## 2013-09-22 DIAGNOSIS — E669 Obesity, unspecified: Secondary | ICD-10-CM

## 2013-09-22 DIAGNOSIS — M5416 Radiculopathy, lumbar region: Secondary | ICD-10-CM

## 2013-09-22 DIAGNOSIS — H6120 Impacted cerumen, unspecified ear: Secondary | ICD-10-CM

## 2013-09-22 MED ORDER — ZOLPIDEM TARTRATE 10 MG PO TABS: 10.0000 mg | ORAL_TABLET | Freq: Every evening | ORAL | Status: DC | PRN

## 2013-09-22 MED ORDER — CHOLESTYRAMINE LIGHT 4 G PO PACK: 4.0000 g | PACK | Freq: Two times a day (BID) | ORAL | Status: DC

## 2013-09-22 NOTE — Progress Notes (Signed)
she is here for a postop visit following laparoscopic cholecystectomy.  Diet is being tolerated, but having diarrhea.Still has nausea and heartburn .  No problems with incisions.  PE:  ABD:  Soft, incisions clean/dry/intact and solid.  Assessment:  Doing well postop.  Plan:  Lowfat diet recommended.  Activities as tolerated.  Return visit prn. Refer to GI medicine and try questran for diarrhea.

## 2013-09-22 NOTE — Progress Notes (Addendum)
  Subjective:    CC: Followup  HPI: Insomnia: Has gone back to Ambien and is doing well.  Obesity: For the last month has been off of her phentermine, she did have a cholecystectomy.  Dizziness: Feels like the right ear is full, symptoms are predominantly vertiginous.  Right lumbar radiculitis: Right L5-S1 transforaminal injection by Dr. Earley Favor has provided complete radicular pain relief.  Past medical history, Surgical history, Family history not pertinant except as noted below, Social history, Allergies, and medications have been entered into the medical record, reviewed, and no changes needed.   Review of Systems: No fevers, chills, night sweats, weight loss, chest pain, or shortness of breath.   Objective:    General: Well Developed, well nourished, and in no acute distress.  Neuro: Alert and oriented x3, extra-ocular muscles intact, sensation grossly intact.  HEENT: Normocephalic, atraumatic, pupils equal round reactive to light, neck supple, no masses, no lymphadenopathy, thyroid nonpalpable. There is a piece of cerumen sitting against the tympanic membrane in the right canal  Skin: Warm and dry, no rashes. Cardiac: Regular rate and rhythm, no murmurs rubs or gallops, no lower extremity edema.  Respiratory: Clear to auscultation bilaterally. Not using accessory muscles, speaking in full sentences.  Indication: Cerumen impaction of the ear(s) Medical necessity statement: On physical examination, cerumen impairs clinically significant portions of the external auditory canal, and tympanic membrane. Noted obstructive, copious cerumen that cannot be removed without magnification and instrumentations requiring physician skills Consent: Discussed benefits and risks of procedure and verbal consent obtained Procedure: Patient was prepped for the procedure. Utilized an otoscope to assess and take note of the ear canal, the tympanic membrane, and the presence, amount, and placement of the  cerumen. Gentle water irrigation and soft plastic curette was utilized to remove cerumen.  Post procedure examination: shows cerumen was completely removed. Patient tolerated procedure well. The patient is made aware that they may experience temporary vertigo, temporary hearing loss, and temporary discomfort. If these symptom last for more than 24 hours to call the clinic or proceed to the ED. Impression and Recommendations:

## 2013-09-22 NOTE — Assessment & Plan Note (Signed)
She is getting some vertiginous symptoms. There is a piece of wax against her right tympanic membrane. Irrigated return as needed

## 2013-09-22 NOTE — Patient Instructions (Signed)
Take questran before each meal.  Will refer to GI medicine.  No restrictions.

## 2013-09-22 NOTE — Assessment & Plan Note (Signed)
Unfortunately has gained 3 pounds, she did have a cholecystectomy in the interim between when I last saw her and has not been taking phentermine. Continue phentermine, returned a month to recheck weight.

## 2013-09-22 NOTE — Assessment & Plan Note (Signed)
Switched back to Ambien, doing well. Needs refill.

## 2013-09-22 NOTE — Assessment & Plan Note (Signed)
Relieved after epidural.

## 2013-09-25 ENCOUNTER — Other Ambulatory Visit: Payer: Self-pay | Admitting: Sports Medicine

## 2013-09-27 ENCOUNTER — Other Ambulatory Visit: Payer: Self-pay | Admitting: Orthopedic Surgery

## 2013-09-27 DIAGNOSIS — M542 Cervicalgia: Secondary | ICD-10-CM

## 2013-09-29 ENCOUNTER — Ambulatory Visit
Admission: RE | Admit: 2013-09-29 | Discharge: 2013-09-29 | Disposition: A | Discharge status: Home / Self Care | Payer: BC Managed Care – PPO | Source: Ambulatory Visit | Attending: Orthopedic Surgery | Admitting: Orthopedic Surgery

## 2013-09-29 DIAGNOSIS — M542 Cervicalgia: Secondary | ICD-10-CM

## 2013-10-17 ENCOUNTER — Ambulatory Visit: Payer: BC Managed Care – PPO | Admitting: Sports Medicine

## 2013-10-20 ENCOUNTER — Ambulatory Visit: Payer: BC Managed Care – PPO | Admitting: Sports Medicine

## 2013-10-20 ENCOUNTER — Ambulatory Visit (INDEPENDENT_AMBULATORY_CARE_PROVIDER_SITE_OTHER): Payer: BC Managed Care – PPO | Admitting: Sports Medicine

## 2013-10-20 ENCOUNTER — Encounter: Payer: Self-pay | Admitting: Sports Medicine

## 2013-10-20 VITALS — BP 109/63 | HR 90 | Ht 65.0 in | Wt 166.0 lb

## 2013-10-20 DIAGNOSIS — E669 Obesity, unspecified: Secondary | ICD-10-CM

## 2013-10-20 DIAGNOSIS — M5416 Radiculopathy, lumbar region: Secondary | ICD-10-CM

## 2013-10-20 DIAGNOSIS — IMO0002 Reserved for concepts with insufficient information to code with codable children: Secondary | ICD-10-CM

## 2013-10-20 DIAGNOSIS — M5030 Other cervical disc degeneration, unspecified cervical region: Secondary | ICD-10-CM

## 2013-10-20 DIAGNOSIS — F329 Major depressive disorder, single episode, unspecified: Secondary | ICD-10-CM

## 2013-10-20 DIAGNOSIS — J410 Simple chronic bronchitis: Secondary | ICD-10-CM

## 2013-10-20 DIAGNOSIS — F3289 Other specified depressive episodes: Secondary | ICD-10-CM

## 2013-10-20 MED ORDER — SERTRALINE HCL 100 MG PO TABS: 100.0000 mg | ORAL_TABLET | Freq: Every day | ORAL | Status: DC

## 2013-10-20 MED ORDER — PHENTERMINE HCL 37.5 MG PO CAPS: 37.5000 mg | ORAL_CAPSULE | ORAL | Status: DC

## 2013-10-20 NOTE — Assessment & Plan Note (Signed)
Switching from citalopram to Zoloft. We are also trying to stop hot flashes as well.

## 2013-10-20 NOTE — Assessment & Plan Note (Signed)
Excellent response to treatment with Dr. Earley Favor.

## 2013-10-20 NOTE — Progress Notes (Signed)
  Subjective:    CC: Followup  HPI: Depression: Unchanged after 6 weeks on citalopram.  Hot flashes: Unchanged on citalopram.  Chronic bronchitis: Improved significantly with Advair.  Smoker: Still is getting set up to see a hypnotist.  Past medical history, Surgical history, Family history not pertinant except as noted below, Social history, Allergies, and medications have been entered into the medical record, reviewed, and no changes needed.   Review of Systems: No fevers, chills, night sweats, weight loss, chest pain, or shortness of breath.   Objective:    General: Well Developed, well nourished, and in no acute distress.  Neuro: Alert and oriented x3, extra-ocular muscles intact, sensation grossly intact.  HEENT: Normocephalic, atraumatic, pupils equal round reactive to light, neck supple, no masses, no lymphadenopathy, thyroid nonpalpable.  Skin: Warm and dry, no rashes. Cardiac: Regular rate and rhythm, no murmurs rubs or gallops, no lower extremity edema.  Respiratory: Clear to auscultation bilaterally. Not using accessory muscles, speaking in full sentences.  Impression and Recommendations:

## 2013-10-20 NOTE — Assessment & Plan Note (Signed)
Symptoms have essentially resolved with the Advair. Next step is smoking cessation. We have tried multiple medications, she does have an appointment with a hypnotist coming up.

## 2013-10-20 NOTE — Assessment & Plan Note (Signed)
Continued good response to the L5-S1 epidural.

## 2013-10-20 NOTE — Assessment & Plan Note (Signed)
Refilling phentermine. No weight loss this visit.

## 2013-11-02 ENCOUNTER — Telehealth: Payer: Self-pay

## 2013-11-02 MED ORDER — NYSTATIN 100000 UNIT/ML MT SUSP: 500000.0000 [IU] | Freq: Four times a day (QID) | OROMUCOSAL | Status: DC

## 2013-11-02 NOTE — Telephone Encounter (Signed)
Pt called and stated that she has a yeast infection in her mouth x 2 days. Does she need to come in to be treated to make sure that is a yeast infection or can something be sent into her pharmacy,/Deshon Hsiao,CMA

## 2013-11-02 NOTE — Telephone Encounter (Signed)
Spoke with pt  And she stated that she does not want to use the wash. She stated that she just had a lot of dental work done and the dentist is the one that told her she had a yeast infection. The pt stated that she would rather have a antibiotic./Kadarius Cuffe,CMA

## 2013-11-02 NOTE — Telephone Encounter (Signed)
Nystatin swish and spit called in.

## 2013-11-07 ENCOUNTER — Telehealth: Payer: Self-pay

## 2013-11-07 MED ORDER — FLUCONAZOLE 150 MG PO TABS: 150.0000 mg | ORAL_TABLET | Freq: Once | ORAL | Status: DC

## 2013-11-07 NOTE — Telephone Encounter (Signed)
Patient called stated that she has a yeast infection she would like a Rx called in to her pharmacy. Fareedah Mahler,CMA

## 2013-11-07 NOTE — Telephone Encounter (Signed)
Diflucan 150 mg by mouth x1 called in.

## 2013-11-07 NOTE — Telephone Encounter (Signed)
Spoke to patient advised her that Diflucan has been sent to her pharmacy. Rhonda Cunningham,CMA

## 2013-11-17 ENCOUNTER — Ambulatory Visit (INDEPENDENT_AMBULATORY_CARE_PROVIDER_SITE_OTHER): Payer: BC Managed Care – PPO | Admitting: Sports Medicine

## 2013-11-17 ENCOUNTER — Encounter: Payer: Self-pay | Admitting: Sports Medicine

## 2013-11-17 VITALS — BP 112/73 | HR 76 | Ht 65.0 in | Wt 165.0 lb

## 2013-11-17 DIAGNOSIS — F17200 Nicotine dependence, unspecified, uncomplicated: Secondary | ICD-10-CM | POA: Insufficient documentation

## 2013-11-17 DIAGNOSIS — E669 Obesity, unspecified: Secondary | ICD-10-CM

## 2013-11-17 DIAGNOSIS — F3289 Other specified depressive episodes: Secondary | ICD-10-CM

## 2013-11-17 DIAGNOSIS — F329 Major depressive disorder, single episode, unspecified: Secondary | ICD-10-CM

## 2013-11-17 MED ORDER — VARENICLINE TARTRATE 1 MG PO TABS: 1.0000 mg | ORAL_TABLET | Freq: Two times a day (BID) | ORAL | Status: DC

## 2013-11-17 MED ORDER — SERTRALINE HCL 100 MG PO TABS: 200.0000 mg | ORAL_TABLET | Freq: Every day | ORAL | Status: DC

## 2013-11-17 MED ORDER — PHENTERMINE HCL 37.5 MG PO TABS: 18.7500 mg | ORAL_TABLET | Freq: Every day | ORAL | Status: DC

## 2013-11-17 MED ORDER — VARENICLINE TARTRATE 0.5 MG X 11 & 1 MG X 42 PO MISC: ORAL | Status: DC

## 2013-11-17 NOTE — Assessment & Plan Note (Signed)
Increasing phentermine to one half tab daily.

## 2013-11-17 NOTE — Assessment & Plan Note (Signed)
Restarted Chantix, referral downstairs for behavioral therapy to assist in cessation. Hypnosis did not work.

## 2013-11-17 NOTE — Progress Notes (Signed)
Subjective:    CC: F/U on chronic conditions  HPI: Certraline: Has been effective, and patient has no complaints  Zolpidem: has worked well, but has also given her food cravings in the evenings. She wonders if there is something else that might not make her eat more.  Phentermine: Patient says that the phentermine worked well, but made her mouth dry.  Smoking: Patient attended 2 appointments with a hypnotist; the first went very well, the second was not nearly as effective. She discontinued treatment and has since returned to her previous level of consumption (approx. 2 ppd). She is adamant that she wishes to quit, but has tried and failed chantix, bupropion, and NRT.   Foot: her right foot is slightly sore along the lateral edge.  Past medical history, Surgical history, Family history not pertinant except as noted below, Social history, Allergies, and medications have been entered into the medical record, reviewed, and no changes needed.   Review of Systems: No fevers, chills, night sweats, weight loss, chest pain, or shortness of breath.   Objective:    General: Well Developed, well nourished, and in no acute distress.  Neuro: Alert and oriented x3, extra-ocular muscles intact, sensation grossly intact.  HEENT: Normocephalic, atraumatic, pupils equal round reactive to light, neck supple, no masses, no lymphadenopathy, thyroid nonpalpable.  Skin: Warm and dry, no rashes. Cardiac: Regular rate and rhythm, no murmurs rubs or gallops, no lower extremity edema.  Respiratory: Clear to auscultation bilaterally. Not using accessory muscles, speaking in full sentences.  Right Foot: No visible erythema or swelling. Range of motion is full in all directions. Strength is 5/5 in all directions. No hallux valgus. No pes cavus or pes planus. No abnormal callus noted. No pain over the navicular prominence, or base of fifth metatarsal. No tenderness to palpation of the calcaneal insertion of  plantar fascia. No pain at the Achilles insertion. No pain over the calcaneal bursa. No pain of the retrocalcaneal bursa. Very slight tenderness to palpation over the 5th metatarsals No hallux rigidus or limitus. No tenderness palpation over interphalangeal joints. No pain with compression of the metatarsal heads. Neurovascularly intact distally.  Impression and Recommendations:    Patient is a well appearing 35 year woman who comes to the clinic today to follow up on her chronic medical conditions.   Hot flashes: are well managed on certraline; will continue current regimen  Weight loss: She D/Ced the phentermine because it was drying out her mouth; will restart on half of the previous dose.   Sleeping: the zolpidem worked very well but has given her food cravings. Options were discussed and she feels that this may be a side effect that she is comfortable with. She will continue with the current regimen.  Cigarette cessation: Patient states that cessation is a 15/10 in importance to her, but worries that she has failed most cessation options. Patient was counseled regarding cessation options and referred for CBT with therapy. She will restart chantix.  Right foot pain: no signs of traumatic injury; hardly any tenderness to palpation, no swelling, no bruising. Asked her to wear supportive footwear and call if it failed to resolve  At conclusion; patient mentioned that she would like abx for sinus infection. Patient was counseled regarding likely etiologies and normal course of sinus infection; asked to schedule a new appointment if she experienced worsening or failed to improve after another week.  I spent 40 minutes with this patient, greater than 50% was face-to-face time counseling regarding the above  diagnoses.

## 2013-11-17 NOTE — Assessment & Plan Note (Signed)
Overall good control with Zoloft, increasing to 200 mg daily.

## 2013-12-20 ENCOUNTER — Ambulatory Visit (INDEPENDENT_AMBULATORY_CARE_PROVIDER_SITE_OTHER): Payer: BC Managed Care – PPO | Admitting: Sports Medicine

## 2013-12-20 ENCOUNTER — Encounter: Payer: Self-pay | Admitting: Sports Medicine

## 2013-12-20 VITALS — BP 117/72 | HR 82 | Ht 65.0 in | Wt 165.0 lb

## 2013-12-20 DIAGNOSIS — F3289 Other specified depressive episodes: Secondary | ICD-10-CM | POA: Diagnosis not present

## 2013-12-20 DIAGNOSIS — G4700 Insomnia, unspecified: Secondary | ICD-10-CM

## 2013-12-20 DIAGNOSIS — F329 Major depressive disorder, single episode, unspecified: Secondary | ICD-10-CM | POA: Diagnosis not present

## 2013-12-20 DIAGNOSIS — E669 Obesity, unspecified: Secondary | ICD-10-CM | POA: Diagnosis not present

## 2013-12-20 MED ORDER — NYSTATIN 100000 UNIT/ML MT SUSP: 500000.0000 [IU] | Freq: Four times a day (QID) | OROMUCOSAL | Status: DC

## 2013-12-20 NOTE — Assessment & Plan Note (Signed)
Discontinue phentermine secondary to dry mouth. Nystatin swish.

## 2013-12-20 NOTE — Assessment & Plan Note (Signed)
Restart Zoloft, 50 mg for a week then 100 mg daily.

## 2013-12-20 NOTE — Progress Notes (Signed)
  Subjective:    CC: Followup  HPI: Depression: Discontinue Zoloft due to dry mouth.  Cbesity: Discontinue phentermine due to dry mouth.  Past medical history, Surgical history, Family history not pertinant except as noted below, Social history, Allergies, and medications have been entered into the medical record, reviewed, and no changes needed.   Review of Systems: No fevers, chills, night sweats, weight loss, chest pain, or shortness of breath.   Objective:    General: Well Developed, well nourished, and in no acute distress.  Neuro: Alert and oriented x3, extra-ocular muscles intact, sensation grossly intact.  HEENT: Normocephalic, atraumatic, pupils equal round reactive to light, neck supple, no masses, no lymphadenopathy, thyroid nonpalpable.  Skin: Warm and dry, no rashes. Cardiac: Regular rate and rhythm, no murmurs rubs or gallops, no lower extremity edema.  Respiratory: Clear to auscultation bilaterally. Not using accessory muscles, speaking in full sentences.   Impression and Recommendations:

## 2013-12-20 NOTE — Assessment & Plan Note (Signed)
Continue zolpidem 

## 2014-01-17 ENCOUNTER — Ambulatory Visit (INDEPENDENT_AMBULATORY_CARE_PROVIDER_SITE_OTHER): Payer: BC Managed Care – PPO | Admitting: Sports Medicine

## 2014-01-17 ENCOUNTER — Encounter: Payer: Self-pay | Admitting: Sports Medicine

## 2014-01-17 VITALS — BP 112/72 | HR 76 | Ht 65.0 in | Wt 171.0 lb

## 2014-01-17 DIAGNOSIS — Z0000 Encounter for general adult medical examination without abnormal findings: Secondary | ICD-10-CM | POA: Diagnosis not present

## 2014-01-17 DIAGNOSIS — N951 Menopausal and female climacteric states: Secondary | ICD-10-CM

## 2014-01-17 DIAGNOSIS — F321 Major depressive disorder, single episode, moderate: Secondary | ICD-10-CM | POA: Diagnosis not present

## 2014-01-17 DIAGNOSIS — Z23 Encounter for immunization: Secondary | ICD-10-CM | POA: Diagnosis not present

## 2014-01-17 DIAGNOSIS — G4700 Insomnia, unspecified: Secondary | ICD-10-CM

## 2014-01-17 MED ORDER — TRAZODONE HCL 50 MG PO TABS: 50.0000 mg | ORAL_TABLET | Freq: Every day | ORAL | Status: DC

## 2014-01-17 NOTE — Assessment & Plan Note (Signed)
Influenza vaccine. Mammogram.

## 2014-01-17 NOTE — Assessment & Plan Note (Signed)
Switching to trazodone at bedtime.

## 2014-01-17 NOTE — Assessment & Plan Note (Signed)
Continue Zoloft. Still having some insomnia, adding trazodone 50 mg at bedtime.

## 2014-01-17 NOTE — Progress Notes (Signed)
  Subjective:    CC: Followup  HPI: Vasomotor instability: Improved with Zoloft.  Depression: Doing okay at 100 mg of Zoloft.  Insomnia: continues to have difficulty despite Ambien.  Preventative measures: Due for influenza vaccine, due for mammogram.  Past medical history, Surgical history, Family history not pertinant except as noted below, Social history, Allergies, and medications have been entered into the medical record, reviewed, and no changes needed.   Review of Systems: No fevers, chills, night sweats, weight loss, chest pain, or shortness of breath.   Objective:    General: Well Developed, well nourished, and in no acute distress.  Neuro: Alert and oriented x3, extra-ocular muscles intact, sensation grossly intact.  HEENT: Normocephalic, atraumatic, pupils equal round reactive to light, neck supple, no masses, no lymphadenopathy, thyroid nonpalpable.  Skin: Warm and dry, no rashes. Cardiac: Regular rate and rhythm, no murmurs rubs or gallops, no lower extremity edema.  Respiratory: Clear to auscultation bilaterally. Not using accessory muscles, speaking in full sentences.  Impression and Recommendations:

## 2014-01-17 NOTE — Assessment & Plan Note (Signed)
Hot flashes are well controlled with Zoloft.

## 2014-01-28 ENCOUNTER — Other Ambulatory Visit: Payer: Self-pay | Admitting: Sports Medicine

## 2014-02-06 ENCOUNTER — Telehealth: Payer: Self-pay | Admitting: Sports Medicine

## 2014-02-06 MED ORDER — TRAZODONE HCL 50 MG PO TABS: 50.0000 mg | ORAL_TABLET | Freq: Every day | ORAL | Status: DC

## 2014-02-06 NOTE — Telephone Encounter (Signed)
Joanne [name] called. She would like refill on Trazadone and to switch to using a cream for her hot flashes.   Thank you.

## 2014-02-06 NOTE — Telephone Encounter (Signed)
Refilled trazodone, if we switch to a patch or cream it will be estrogen supplementation and I would want to see her first to discuss risks.

## 2014-02-06 NOTE — Telephone Encounter (Signed)
Dr. T please see note below. Sarajean Dessert,CMA  

## 2014-02-07 NOTE — Telephone Encounter (Signed)
Spoke to patent gave her information as noted below. Cassy Sprowl,CMA

## 2014-02-09 ENCOUNTER — Ambulatory Visit: Payer: BC Managed Care – PPO

## 2014-02-14 ENCOUNTER — Ambulatory Visit (INDEPENDENT_AMBULATORY_CARE_PROVIDER_SITE_OTHER): Payer: BC Managed Care – PPO

## 2014-02-14 ENCOUNTER — Telehealth: Payer: Self-pay | Admitting: *Deleted

## 2014-02-14 ENCOUNTER — Encounter: Payer: Self-pay | Admitting: Sports Medicine

## 2014-02-14 ENCOUNTER — Ambulatory Visit (INDEPENDENT_AMBULATORY_CARE_PROVIDER_SITE_OTHER): Payer: BC Managed Care – PPO | Admitting: Sports Medicine

## 2014-02-14 DIAGNOSIS — M5417 Radiculopathy, lumbosacral region: Secondary | ICD-10-CM

## 2014-02-14 DIAGNOSIS — Z1389 Encounter for screening for other disorder: Secondary | ICD-10-CM

## 2014-02-14 DIAGNOSIS — M5416 Radiculopathy, lumbar region: Secondary | ICD-10-CM

## 2014-02-14 DIAGNOSIS — G4700 Insomnia, unspecified: Secondary | ICD-10-CM

## 2014-02-14 DIAGNOSIS — N951 Menopausal and female climacteric states: Secondary | ICD-10-CM

## 2014-02-14 DIAGNOSIS — M179 Osteoarthritis of knee, unspecified: Secondary | ICD-10-CM

## 2014-02-14 MED ORDER — PHENTERMINE HCL 37.5 MG PO CAPS: ORAL_CAPSULE | ORAL | Status: DC

## 2014-02-14 MED ORDER — TRAZODONE HCL 150 MG PO TABS: 150.0000 mg | ORAL_TABLET | Freq: Every day | ORAL | Status: DC

## 2014-02-14 MED ORDER — ESTRADIOL 0.025 MG/24HR TD PTWK: 0.0250 mg | MEDICATED_PATCH | TRANSDERMAL | Status: DC

## 2014-02-14 NOTE — Progress Notes (Signed)
  Subjective:    CC: follow-up  HPI: Insomnia: Doing well on trazodone 150, she needs a refill.  Vasomotor instability: Self discontinued sertraline, did not like how it made her feel, she also did not respond sufficiently to her gabapentin. She is ready to start hormonal replacement.  Right knee pain: Moderate, persistent and sounds arthritic. She does have right lumbar radiculitis as well, I do want her to follow up with Dr. Earley Favor for epidurals, and see how she does after starting hormone replacement before we consider interventional treatment into the knee.  Past medical history, Surgical history, Family history not pertinant except as noted below, Social history, Allergies, and medications have been entered into the medical record, reviewed, and no changes needed.   Review of Systems: No fevers, chills, night sweats, weight loss, chest pain, or shortness of breath.   Objective:    General: Well Developed, well nourished, and in no acute distress.  Neuro: Alert and oriented x3, extra-ocular muscles intact, sensation grossly intact.  HEENT: Normocephalic, atraumatic, pupils equal round reactive to light, neck supple, no masses, no lymphadenopathy, thyroid nonpalpable.  Skin: Warm and dry, no rashes. Cardiac: Regular rate and rhythm, no murmurs rubs or gallops, no lower extremity edema.  Respiratory: Clear to auscultation bilaterally. Not using accessory muscles, speaking in full sentences. Right Knee: Normal to inspection with no erythema or effusion or obvious bony abnormalities. Tender to palpation at the posterior medial joint line. ROM normal in flexion and extension and lower leg rotation. Ligaments with solid consistent endpoints including ACL, PCL, LCL, MCL. Negative Mcmurray's and provocative meniscal tests. Non painful patellar compression. Patellar and quadriceps tendons unremarkable. Hamstring and quadriceps strength is normal.  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  Impression and Recommendations:

## 2014-02-14 NOTE — Assessment & Plan Note (Signed)
Insufficient response to gabapentin, self discontinued SSRI. At this point she is amenable to starting hormone replacement.  We will start with estrogen transdermal.  Risks, benefits, alternatives explained. We can increase dose future visits if needed.

## 2014-02-14 NOTE — Telephone Encounter (Signed)
Refilling phentermine, she does need to return monthly for weight checks. Prescription is in my box.

## 2014-02-14 NOTE — Telephone Encounter (Signed)
Pt wanted to know if you were going to refill her phentermine.  She stated that she didn't like it before while she was taking the sertraline.  The combo of the 2 made her mouth too dry.

## 2014-02-14 NOTE — Assessment & Plan Note (Signed)
Continues to have right knee pain, Dr. Earley Favor is doing epidurals. Some of her pain sounds to be arthritic, knee x-rays, if estrogen replacement does not help, I'm happy to injection.

## 2014-02-14 NOTE — Assessment & Plan Note (Signed)
Refilling trazodone at 150.

## 2014-02-15 NOTE — Telephone Encounter (Signed)
Rx faxed    Pt notified

## 2014-02-16 ENCOUNTER — Ambulatory Visit: Payer: BC Managed Care – PPO

## 2014-02-23 ENCOUNTER — Ambulatory Visit: Payer: BC Managed Care – PPO

## 2014-03-01 ENCOUNTER — Ambulatory Visit (INDEPENDENT_AMBULATORY_CARE_PROVIDER_SITE_OTHER): Payer: BC Managed Care – PPO

## 2014-03-01 ENCOUNTER — Other Ambulatory Visit: Payer: Self-pay | Admitting: Sports Medicine

## 2014-03-01 DIAGNOSIS — R928 Other abnormal and inconclusive findings on diagnostic imaging of breast: Secondary | ICD-10-CM

## 2014-03-07 ENCOUNTER — Other Ambulatory Visit: Payer: Self-pay | Admitting: Sports Medicine

## 2014-03-07 DIAGNOSIS — R928 Other abnormal and inconclusive findings on diagnostic imaging of breast: Secondary | ICD-10-CM

## 2014-03-14 ENCOUNTER — Encounter: Payer: Self-pay | Admitting: Sports Medicine

## 2014-03-14 ENCOUNTER — Other Ambulatory Visit: Payer: Self-pay | Admitting: *Deleted

## 2014-03-14 ENCOUNTER — Ambulatory Visit (INDEPENDENT_AMBULATORY_CARE_PROVIDER_SITE_OTHER): Payer: BC Managed Care – PPO | Admitting: Sports Medicine

## 2014-03-14 ENCOUNTER — Ambulatory Visit (INDEPENDENT_AMBULATORY_CARE_PROVIDER_SITE_OTHER): Payer: BC Managed Care – PPO

## 2014-03-14 VITALS — BP 130/76 | HR 101 | Wt 175.0 lb

## 2014-03-14 DIAGNOSIS — G4719 Other hypersomnia: Secondary | ICD-10-CM | POA: Diagnosis not present

## 2014-03-14 DIAGNOSIS — R1013 Epigastric pain: Secondary | ICD-10-CM | POA: Diagnosis not present

## 2014-03-14 DIAGNOSIS — Z9049 Acquired absence of other specified parts of digestive tract: Secondary | ICD-10-CM

## 2014-03-14 DIAGNOSIS — F321 Major depressive disorder, single episode, moderate: Secondary | ICD-10-CM | POA: Diagnosis not present

## 2014-03-14 DIAGNOSIS — N951 Menopausal and female climacteric states: Secondary | ICD-10-CM | POA: Diagnosis not present

## 2014-03-14 MED ORDER — ESTRADIOL 0.05 MG/24HR TD PTWK: 0.0500 mg | MEDICATED_PATCH | TRANSDERMAL | Status: DC

## 2014-03-14 MED ORDER — ESOMEPRAZOLE MAGNESIUM 40 MG PO CPDR: DELAYED_RELEASE_CAPSULE | ORAL | Status: DC

## 2014-03-14 NOTE — Assessment & Plan Note (Signed)
Increasing estradiol dose.

## 2014-03-14 NOTE — Assessment & Plan Note (Signed)
With persistent irritability. Referral downstairs to psychiatry.

## 2014-03-14 NOTE — Assessment & Plan Note (Signed)
Checking a CBC, metabolic panel, TSH. Return to go over results.

## 2014-03-14 NOTE — Assessment & Plan Note (Signed)
Checking blood work, H. pylori. Return in a month. 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 Adding Nexium.

## 2014-03-14 NOTE — Progress Notes (Signed)
  Subjective:    CC: Follow-up  HPI: Vasomotor instability, slightly improved with low-dose estrogen patch.  Irritability: Currently treating her for depression, unfortunately is having persistent and refractory irritability and is amenable to discussion with psychiatry downstairs.  Abdominal pain: Epigastric and right upper quadrant, no nausea, no hematemesis, hematochezia or melena. Moderate, persistent without fevers, chills, night sweats or weight loss.  Fatigue: Feels excessive daytime sleepiness, he is told that she snores excessively by others.  Past medical history, Surgical history, Family history not pertinant except as noted below, Social history, Allergies, and medications have been entered into the medical record, reviewed, and no changes needed.   Review of Systems: No fevers, chills, night sweats, weight loss, chest pain, or shortness of breath.   Objective:    General: Well Developed, well nourished, and in no acute distress.  Neuro: Alert and oriented x3, extra-ocular muscles intact, sensation grossly intact.  HEENT: Normocephalic, atraumatic, pupils equal round reactive to light, neck supple, no masses, no lymphadenopathy, thyroid nonpalpable.  Skin: Warm and dry, no rashes. Cardiac: Regular rate and rhythm, no murmurs rubs or gallops, no lower extremity edema.  Respiratory: Clear to auscultation bilaterally. Not using accessory muscles, speaking in full sentences. Abdomen: Soft, minimally tender to palpation in the epigastrium and right upper quadrant, nondistended, normal bowel sounds, no palpable masses.  Impression and Recommendations:

## 2014-03-15 LAB — COMPREHENSIVE METABOLIC PANEL
ALT: 19 U/L (ref 0–35)
AST: 18 U/L (ref 0–37)
Albumin: 4.5 g/dL (ref 3.5–5.2)
Alkaline Phosphatase: 97 U/L (ref 39–117)
BUN: 7 mg/dL (ref 6–23)
Calcium: 9.7 mg/dL (ref 8.4–10.5)
Creat: 0.49 mg/dL — ABNORMAL LOW (ref 0.50–1.10)
Glucose, Bld: 85 mg/dL (ref 70–99)
Potassium: 3.9 mEq/L (ref 3.5–5.3)
Total Protein: 7.3 g/dL (ref 6.0–8.3)

## 2014-03-15 LAB — CBC
HCT: 41.8 % (ref 36.0–46.0)
Hemoglobin: 13.9 g/dL (ref 12.0–15.0)
MCH: 30.7 pg (ref 26.0–34.0)
MCHC: 33.3 g/dL (ref 30.0–36.0)
MCV: 92.3 fL (ref 78.0–100.0)
MPV: 10.9 fL (ref 9.4–12.4)
Platelets: 225 10*3/uL (ref 150–400)
RBC: 4.53 MIL/uL (ref 3.87–5.11)
RDW: 13.2 % (ref 11.5–15.5)
WBC: 6.8 10*3/uL (ref 4.0–10.5)

## 2014-03-15 LAB — COMPREHENSIVE METABOLIC PANEL WITH GFR
CO2: 28 meq/L (ref 19–32)
Chloride: 101 meq/L (ref 96–112)
Sodium: 136 meq/L (ref 135–145)
Total Bilirubin: 0.3 mg/dL (ref 0.2–1.2)

## 2014-03-15 LAB — H. PYLORI ANTIBODY, IGG: H Pylori IgG: <0.4 {ISR}

## 2014-03-15 LAB — LIPASE: Lipase: 19 U/L (ref 0–75)

## 2014-03-21 ENCOUNTER — Ambulatory Visit
Admission: RE | Admit: 2014-03-21 | Discharge: 2014-03-21 | Disposition: A | Discharge status: Home / Self Care | Payer: BC Managed Care – PPO | Source: Ambulatory Visit | Attending: Sports Medicine | Admitting: Sports Medicine

## 2014-03-21 ENCOUNTER — Other Ambulatory Visit: Payer: Self-pay | Admitting: Sports Medicine

## 2014-03-21 DIAGNOSIS — R928 Other abnormal and inconclusive findings on diagnostic imaging of breast: Secondary | ICD-10-CM

## 2014-03-21 DIAGNOSIS — N644 Mastodynia: Secondary | ICD-10-CM

## 2014-03-23 ENCOUNTER — Ambulatory Visit (HOSPITAL_COMMUNITY): Payer: BC Managed Care – PPO | Admitting: Psychiatry

## 2014-03-29 ENCOUNTER — Telehealth: Payer: Self-pay | Admitting: Sports Medicine

## 2014-03-29 NOTE — Telephone Encounter (Signed)
Called patient left message on patient mvm advising that Trazodone #90 [id number] was sent in  On 02/14/2014 to [facility] advised patient to call her pharmacy for refills. Rhonda Cunningham,CMA

## 2014-03-29 NOTE — Telephone Encounter (Signed)
Spoke to pharmacist  At [facility] about the Trazodone and he stated that #30 was filled on 02/14/14 and #30 was filled on 03/11/2014 so therefore no refills will be approved at this time because it is too early. Rhonda Cunningham,CMA

## 2014-03-29 NOTE — Telephone Encounter (Signed)
Pt called. She has not hear anything from u s regarding refill on Trazodone.  She stated she called last week. Thank you

## 2014-04-03 ENCOUNTER — Encounter: Payer: Self-pay | Admitting: Sports Medicine

## 2014-04-03 ENCOUNTER — Ambulatory Visit (INDEPENDENT_AMBULATORY_CARE_PROVIDER_SITE_OTHER): Payer: BC Managed Care – PPO | Admitting: Sports Medicine

## 2014-04-03 VITALS — BP 106/73 | HR 78 | Ht 65.0 in | Wt 179.0 lb

## 2014-04-03 DIAGNOSIS — M5417 Radiculopathy, lumbosacral region: Secondary | ICD-10-CM

## 2014-04-03 DIAGNOSIS — R2232 Localized swelling, mass and lump, left upper limb: Secondary | ICD-10-CM

## 2014-04-03 DIAGNOSIS — M5416 Radiculopathy, lumbar region: Secondary | ICD-10-CM

## 2014-04-03 NOTE — Assessment & Plan Note (Signed)
Recurrence of right-sided L5 radiculopathy, it seems as though her most recent right-sided L5-S1 epidural was 6 months ago. I would like her to return to Dr. Earley Favor for a repeat epidural. She is already established with him.

## 2014-04-03 NOTE — Progress Notes (Signed)
  Subjective:    CC: right leg pain  HPI: Joanne [name] has a history of right-sided lumbar radiculopathy, she did have a right-sided L5-S1 transforaminal epidural done by Dr. Earley Favor approximately 6 months ago with a good response. She is now having a recurrence of pain and wonders if another epidural can be done.  Axillary mass: Left-sided, present for a few weeks, nontender, no constitutional symptoms.  Past medical history, Surgical history, Family history not pertinant except as noted below, Social history, Allergies, and medications have been entered into the medical record, reviewed, and no changes needed.   Review of Systems: No fevers, chills, night sweats, weight loss, chest pain, or shortness of breath.   Objective:    General: Well Developed, well nourished, and in no acute distress.  Neuro: Alert and oriented x3, extra-ocular muscles intact, sensation grossly intact.  HEENT: Normocephalic, atraumatic, pupils equal round reactive to light, neck supple, no masses, no lymphadenopathy, thyroid nonpalpable.  Skin: Warm and dry, no rashes. Cardiac: Regular rate and rhythm, no murmurs rubs or gallops, no lower extremity edema.  Respiratory: Clear to auscultation bilaterally. Not using accessory muscles, speaking in full sentences. Left axilla: There is a 1 cm well-defined, nodule. Nontender. No erythema or induration.  Impression and Recommendations:

## 2014-04-03 NOTE — Assessment & Plan Note (Signed)
There is a blueberry sized nontender mass in the left axilla. I am going to obtain an ultrasound for further evaluation.

## 2014-04-04 ENCOUNTER — Telehealth: Payer: Self-pay | Admitting: *Deleted

## 2014-04-04 ENCOUNTER — Ambulatory Visit (INDEPENDENT_AMBULATORY_CARE_PROVIDER_SITE_OTHER): Payer: BC Managed Care – PPO

## 2014-04-04 DIAGNOSIS — G4700 Insomnia, unspecified: Secondary | ICD-10-CM

## 2014-04-04 DIAGNOSIS — R2232 Localized swelling, mass and lump, left upper limb: Secondary | ICD-10-CM

## 2014-04-04 MED ORDER — TRAZODONE HCL 50 MG PO TABS: 50.0000 mg | ORAL_TABLET | Freq: Every day | ORAL | Status: DC

## 2014-04-04 NOTE — Telephone Encounter (Signed)
Joanne [name] was notified of trazodone script at pharmacy.

## 2014-04-04 NOTE — Telephone Encounter (Signed)
Done

## 2014-04-04 NOTE — Telephone Encounter (Signed)
Kanisha stopped in office and she would like her trazadone changed to 50mg  not 150mg  as she was previously taking. She said that if she breaks those pills in half it is hard for her to swallow since the pills are so large. Can you refill for 50mg . Please advise.

## 2014-05-01 ENCOUNTER — Ambulatory Visit: Payer: BC Managed Care – PPO | Admitting: Sports Medicine

## 2014-05-01 DIAGNOSIS — Z0289 Encounter for other administrative examinations: Secondary | ICD-10-CM

## 2014-12-19 DIAGNOSIS — K2210 Ulcer of esophagus without bleeding: Secondary | ICD-10-CM | POA: Insufficient documentation

## 2016-03-30 IMAGING — NM NM HEPATO W/GB/PHARM/[PERSON_NAME]
2 series · 12 of 12 positions shown · non-contrast
Comparison: US ABDOMEN COMPLETE dated 07/08/2013

CLINICAL DATA: Pain.

EXAM:
NUCLEAR MEDICINE HEPATOBILIARY IMAGING WITH GALLBLADDER EF
TECHNIQUE: Sequential images of the abdomen were obtained [DATE] minutes
following intravenous administration of radiopharmaceutical. After
slow intravenous infusion of 1.6 micrograms Cholecystokinin,
gallbladder ejection fraction was determined.
RADIOPHARMACEUTICALS:  5.0 [id number] Choletec

[Series 0: hepatobiliary · 3.20mm/px · 6 of 30 frames shown (1 of 2)]
[frame 3/30]
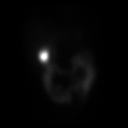
[frame 8/30]
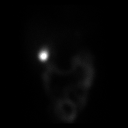
[frame 13/30]
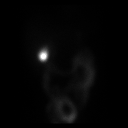
[frame 18/30]
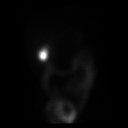
[frame 23/30]
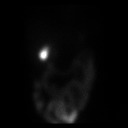
[frame 28/30]
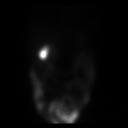

[Series 0: hepatobiliary · 3.20mm/px · 6 of 38 frames shown (2 of 2)]
[frame 4/38]
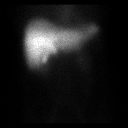
[frame 10/38]
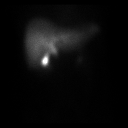
[frame 16/38]
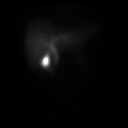
[frame 23/38]
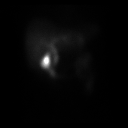
[frame 29/38]
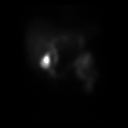
[frame 35/38]
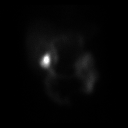

[12 of 12 positions shown; findings below may reference images not displayed]

FINDINGS: Liver, gallbladder, biliary system, and bowel appear normal.
Gallbladder ejection fraction 47% at 30 min. At 30 min, normal
ejection fraction is greater than 30%.

The patient did not experience symptoms during CCK infusion.
IMPRESSION: Normal exam.

## 2017-06-18 ENCOUNTER — Other Ambulatory Visit: Payer: Self-pay

## 2017-06-18 ENCOUNTER — Emergency Department (INDEPENDENT_AMBULATORY_CARE_PROVIDER_SITE_OTHER)
Admission: EM | Admit: 2017-06-18 | Discharge: 2017-06-18 | Disposition: A | Discharge status: Home / Self Care | Payer: 59 | Source: Home / Self Care | Attending: Emergency Medicine | Admitting: Emergency Medicine

## 2017-06-18 ENCOUNTER — Encounter: Payer: Self-pay | Admitting: Emergency Medicine

## 2017-06-18 DIAGNOSIS — R5383 Other fatigue: Secondary | ICD-10-CM | POA: Diagnosis not present

## 2017-06-18 DIAGNOSIS — Z8639 Personal history of other endocrine, nutritional and metabolic disease: Secondary | ICD-10-CM

## 2017-06-18 HISTORY — DX: Disorder of thyroid, unspecified: E07.9

## 2017-06-18 NOTE — ED Provider Notes (Signed)
[name]    CSN: [id number] Arrival date & time: 06/18/17  1110     History   Chief Complaint Chief Complaint  Patient presents with  . Fatigue    HPI Joanne [name] is a 47 y.o. female.   HPI 47 year old female, in the past lived in [place], then moved out of state, and recently moved back to [place]. She states she was on thyroid medicine prior to moving out of state 2 years ago, but she stopped taking that medicine. Her chief complaint is many months of fatigue, 20 pound weight loss, and she requests various tests to evaluate thyroid and other blood tests to evaluate the symptoms.  She has an appointment to reestablish with Dr. [name], her PCP on Tuesday, 3/19. She denies any acute symptoms. She denies chest pain or shortness of breath or abdominal pain or nausea or vomiting or fever or chills.  Reviewed multiple prior diagnoses that are in her EMR.  Past Medical History:  Diagnosis Date  . Anemia    h/o of Bld. Transfusion post C/Section  . Anxiety    panic attacks - several yrs. ago, pt. reports that she is claustrophobic     . Arthritis    shoulders- ? bursitis   . [facility])    hysterectomy for preCA of the cervix    . GERD (gastroesophageal reflux disease)   . Headache(784.0)   . Irregular heartbeat    pt. states that she has been told that she has an irreg. heartbeat on occas.  . Shortness of breath   . Thyroid disease   . Tobacco abuse     Patient Active Problem List   Diagnosis Date Noted  . Mass of left axilla 04/03/2014  . Excessive daytime sleepiness 03/14/2014  . Abdominal pain, epigastric 03/14/2014  . Annual physical exam 01/17/2014  . Smoker 11/17/2013  . Obesity 08/24/2013  . Right lumbar radiculitis 06/07/2013  . Venous stasis dermatitis 12/09/2012  . Insomnia 11/24/2012  . Major depression 11/24/2012  . Menopausal symptoms 11/24/2012  . Dysplasia of cervix 11/24/2012  . 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 ABDOMINAL HYSTERECTOMY     bowel repair /w Davinci Robot procedure   . ANTERIOR CERVICAL DECOMP/DISCECTOMY FUSION N/A 01/06/2013   Procedure: ANTERIOR CERVICAL DECOMPRESSION/DISCECTOMY FUSION 2 LEVELS;  Surgeon: Sinclair Ship, MD;  Location: [facility];  Service: Orthopedics;  Laterality: N/A;  Anterior cervical decompression fusion, cervical 5-6, cervical 6-7 with instrumentation and allograft  . CESAREAN SECTION  1994   tubal ligation   . CHOLECYSTECTOMY N/A 09/06/2013   Procedure: LAPAROSCOPIC CHOLECYSTECTOMY WITH INTRAOPERATIVE CHOLANGIOGRAM;  Surgeon: Joyice Faster. Cornett, MD;  Location: [facility];  Service: General;  Laterality: N/A;  . DENTAL SURGERY  02-2013   implants placed  . OOPHORECTOMY    . TONSILLECTOMY      OB History    No data available       Home Medications    Prior to Admission medications   Medication Sig Start Date End Date Taking? Authorizing Provider  Multiple Vitamins-Minerals (CENTRUM PO) Take 1 tablet by mouth daily.    [provider]    Family History Family History  Problem Relation Age of Onset  . Depression Mother   . Heart attack Father   . Hypertension Father   . Cancer Sister   . Alcohol abuse Brother   . Diabetes Maternal Grandmother   . Cancer Sister  Social History Social History   Tobacco Use  . Smoking status: Current Every Day Smoker    Packs/day: 1.00  . Smokeless tobacco: Never Used  Substance Use Topics  . Alcohol use: No  . Drug use: No     Allergies   Morphine and related   Review of Systems Review of Systems  Constitutional: Negative for fever.  Neurological: Negative for seizures and syncope.   See also HPI  Physical Exam Triage Vital Signs ED Triage Vitals  Enc Vitals Group     BP 06/18/17 1138 113/78     Pulse Rate 06/18/17 1138 78     Resp 06/18/17 1138 16     Temp 06/18/17 1138 97.9 F (36.6 C)     Temp  Source 06/18/17 1138 Oral     SpO2 06/18/17 1138 99 %     Weight 06/18/17 1139 159 lb (72.1 kg)     Height 06/18/17 1139 5\' 5"  (1.651 m)     Head Circumference --      Peak Flow --      Pain Score --      Pain Loc --      Pain Edu? --      Excl. in [place]? --    No data found.  Updated Vital Signs BP 113/78 (BP Location: Right Arm)   Pulse 78   Temp 97.9 F (36.6 C) (Oral)   Resp 16   Ht 5\' 5"  (1.651 m)   Wt 159 lb (72.1 kg)   SpO2 99%   BMI 26.46 kg/m   Visual Acuity Right Eye Distance:   Left Eye Distance:   Bilateral Distance:    Right Eye Near:   Left Eye Near:    Bilateral Near:     Physical Exam  Constitutional: She is oriented to person, place, and time. She appears well-developed and well-nourished. No distress.  HENT:  Head: Normocephalic and atraumatic.  Eyes: Pupils are equal, round, and reactive to light. No scleral icterus.  Neck: Normal range of motion. Neck supple.  Cardiovascular: Normal rate and regular rhythm.  Pulmonary/Chest: Effort normal.  Abdominal: She exhibits no distension.  Neurological: She is alert and oriented to person, place, and time. No cranial nerve deficit.  Skin: Skin is warm and dry.  Psychiatric: She has a normal mood and affect. Her behavior is normal.  Vitals reviewed.    UC Treatments / Results  Labs (all labs ordered are listed, but only abnormal results are displayed) Labs Reviewed  COMPLETE METABOLIC PANEL WITH GFR  CBC WITH DIFFERENTIAL/PLATELET  SEDIMENTATION RATE  TSH  T4, FREE  T3  T3 UPTAKE    EKG  EKG Interpretation None       Radiology No results found.  Procedures Procedures (including critical care time)  Medications Ordered in UC Medications - No data to display   Initial Impression / Assessment and Plan / UC Course  I have reviewed the triage vital signs and the nursing notes.  Pertinent labs & imaging results that were available during my care of the patient were reviewed by me and  considered in my medical decision making (see chart for details).    In-house CBC today within normal limits.  Hemoglobin 15.1, WBC 5.6.  Final Clinical Impressions(s) / UC Diagnoses   Final diagnoses:  Other fatigue  Personal history of hyperthyroidism  No evidence of any acute life-threatening process, and I explained the patient that further workup and treatment will need to be done by  her PCP. At patient request, labs ordered today as a courtesy to patient, so these labs will be available when she has her reestablish appointment with Dr. [name] on Tuesday. Labs Reviewed  COMPLETE METABOLIC PANEL WITH GFR  CBC WITH DIFFERENTIAL/PLATELET  SEDIMENTATION RATE  TSH  T4, FREE  T3  T3 UPTAKE   She requested I start on thyroid Rx, but I advised that would not be appropriate prior to obtaining lab results and any further diagnosis and management would need to be done by Dr. [name], her PCP  ED Discharge Orders    None     Over 30 minutes spent, greater than 50% of the time spent for counseling and coordination of care.    Jacqulyn Cane, MD 06/18/17 1425

## 2017-06-18 NOTE — ED Triage Notes (Signed)
Patient knows she has thyroid disease but has not been taking any rx for a long time; needs to re-establish PCP after moving here 11 months ago.

## 2017-06-19 ENCOUNTER — Telehealth: Payer: Self-pay | Admitting: Emergency Medicine

## 2017-06-19 LAB — CBC WITH DIFFERENTIAL/PLATELET
Basophils Absolute: 22 cells/uL (ref 0–200)
Basophils Relative: 0.4 %
Eosinophils Absolute: 39 cells/uL (ref 15–500)
Eosinophils Relative: 0.7 %
HCT: 42 % (ref 35.0–45.0)
Hemoglobin: 14.7 g/dL (ref 11.7–15.5)
Lymphs Abs: 1814 cells/uL (ref 850–3900)
MCH: 31.7 pg (ref 27.0–33.0)
MCHC: 35 g/dL (ref 32.0–36.0)
MCV: 90.5 fL (ref 80.0–100.0)
MPV: 11.6 fL (ref 7.5–12.5)
Monocytes Relative: 7.9 %
Neutro Abs: 3282 cells/uL (ref 1500–7800)
Neutrophils Relative %: 58.6 %
Platelets: 186 10*3/uL (ref 140–400)
RBC: 4.64 10*6/uL (ref 3.80–5.10)
RDW: 12.3 % (ref 11.0–15.0)
Total Lymphocyte: 32.4 %
WBC mixed population: 442 cells/uL (ref 200–950)
WBC: 5.6 10*3/uL (ref 3.8–10.8)

## 2017-06-19 LAB — COMPLETE METABOLIC PANEL WITH GFR
AG Ratio: 1.5 (calc) (ref 1.0–2.5)
ALT: 9 U/L (ref 6–29)
AST: 14 U/L (ref 10–35)
Albumin: 4.3 g/dL (ref 3.6–5.1)
Alkaline phosphatase (APISO): 78 U/L (ref 33–115)
BUN: 9 mg/dL (ref 7–25)
CO2: 26 mmol/L (ref 20–32)
Calcium: 9.3 mg/dL (ref 8.6–10.2)
Chloride: 104 mmol/L (ref 98–110)
Creat: 0.56 mg/dL (ref 0.50–1.10)
GFR, Est African American: 129 mL/min/{1.73_m2} (ref >=60)
GFR, Est Non African American: 111 mL/min/{1.73_m2} (ref >=60)
Globulin: 2.8 g/dL (calc) (ref 1.9–3.7)
Glucose, Bld: 83 mg/dL (ref 65–99)
Potassium: 4.8 mmol/L (ref 3.5–5.3)
Sodium: 138 mmol/L (ref 135–146)
Total Bilirubin: 0.4 mg/dL (ref 0.2–1.2)
Total Protein: 7.1 g/dL (ref 6.1–8.1)

## 2017-06-19 LAB — T3 UPTAKE: T3 Uptake: 26 % (ref 22–35)

## 2017-06-19 LAB — TSH: TSH: 6.73 mIU/L — ABNORMAL HIGH

## 2017-06-19 LAB — T3: T3, Total: 138 ng/dL (ref 76–181)

## 2017-06-19 LAB — SEDIMENTATION RATE: Sed Rate: 9 mm/h (ref 0–20)

## 2017-06-19 LAB — T4, FREE: Free T4: 0.9 ng/dL (ref 0.8–1.8)

## 2017-06-23 ENCOUNTER — Ambulatory Visit: Payer: 59 | Admitting: Physician Assistant

## 2017-06-23 ENCOUNTER — Encounter: Payer: Self-pay | Admitting: Physician Assistant

## 2017-06-23 VITALS — BP 122/85 | HR 84 | Wt 163.0 lb

## 2017-06-23 DIAGNOSIS — Z90710 Acquired absence of both cervix and uterus: Secondary | ICD-10-CM | POA: Diagnosis not present

## 2017-06-23 DIAGNOSIS — K2270 Barrett's esophagus without dysplasia: Secondary | ICD-10-CM

## 2017-06-23 DIAGNOSIS — R632 Polyphagia: Secondary | ICD-10-CM | POA: Diagnosis not present

## 2017-06-23 DIAGNOSIS — F5101 Primary insomnia: Secondary | ICD-10-CM

## 2017-06-23 DIAGNOSIS — Z7689 Persons encountering health services in other specified circumstances: Secondary | ICD-10-CM | POA: Diagnosis not present

## 2017-06-23 DIAGNOSIS — E038 Other specified hypothyroidism: Secondary | ICD-10-CM

## 2017-06-23 DIAGNOSIS — Z87410 Personal history of cervical dysplasia: Secondary | ICD-10-CM

## 2017-06-23 DIAGNOSIS — E569 Vitamin deficiency, unspecified: Secondary | ICD-10-CM | POA: Diagnosis not present

## 2017-06-23 DIAGNOSIS — M2550 Pain in unspecified joint: Secondary | ICD-10-CM

## 2017-06-23 DIAGNOSIS — E039 Hypothyroidism, unspecified: Secondary | ICD-10-CM | POA: Diagnosis not present

## 2017-06-23 DIAGNOSIS — Z1231 Encounter for screening mammogram for malignant neoplasm of breast: Secondary | ICD-10-CM

## 2017-06-23 MED ORDER — PANTOPRAZOLE SODIUM 40 MG PO TBEC: 40.0000 mg | DELAYED_RELEASE_TABLET | Freq: Every day | ORAL | 1 refills | Status: DC

## 2017-06-23 MED ORDER — LEVOTHYROXINE SODIUM 25 MCG PO TABS: 25.0000 ug | ORAL_TABLET | Freq: Every day | ORAL | 0 refills | Status: DC

## 2017-06-23 MED ORDER — HYDROXYZINE HCL 25 MG PO TABS: 25.0000 mg | ORAL_TABLET | Freq: Every day | ORAL | 0 refills | Status: DC

## 2017-06-23 MED ORDER — ACETAMINOPHEN ER 650 MG PO TBCR: 1300.0000 mg | EXTENDED_RELEASE_TABLET | Freq: Three times a day (TID) | ORAL | 3 refills | Status: DC | PRN

## 2017-06-23 NOTE — Patient Instructions (Addendum)
   Sleep Hygiene . Limiting daytime naps to 30 minutes . Napping does not make up for inadequate nighttime sleep. However, a short nap of 20-30 minutes can help to improve mood, alertness and performance.  . Avoiding stimulants such as  caffeine and nicotine close to bedtime.  And when it comes to alcohol, moderation is key 4. While alcohol is well-known to help you fall asleep faster, too much close to bedtime can disrupt sleep in the second half of the night as the body begins to process the alcohol.    . Exercising to promote good quality sleep.  As little as 10 minutes of aerobic exercise, such as walking or cycling, can drastically improve nighttime sleep quality.  For the best night's sleep, most people should avoid strenuous workouts close to bedtime. However, the effect of intense nighttime exercise on sleep differs from person to person, so find out what works best for you.   . Steering clear of food that can be disruptive right before sleep.   Heavy or rich foods, fatty or fried meals, spicy dishes, citrus fruits, and carbonated drinks can trigger indigestion for some people. When this occurs close to bedtime, it can lead to painful heartburn that disrupts sleep. . Ensuring adequate exposure to natural light.  This is particularly important for individuals who may not venture outside frequently. Exposure to sunlight during the day, as well as darkness at night, helps to maintain a healthy sleep-wake cycle . . Establishing a regular relaxing bedtime routine.  A regular nightly routine helps the body recognize that it is bedtime. This could include taking warm shower or bath, reading a book, or light stretches. When possible, try to avoid emotionally upsetting conversations and activities before attempting to sleep. . Making sure that the sleep environment is pleasant.  Mattress and pillows should be comfortable. The bedroom should be cool - between 60 and 67 degrees - for optimal sleep. Bright light  from lamps, cell phone and TV screens can make it difficult to fall asleep4, so turn those light off or adjust them when possible. Consider using blackout curtains, eye shades, ear plugs, "white noise" machines, humidifiers, fans and other devices that can make the bedroom more relaxing. . Meditation. YouTube Michael Sealy. There are many smartphone apps as well   

## 2017-06-23 NOTE — Progress Notes (Signed)
HPI:                                                                Joanne [name] is a 47 y.o. female who presents to [facility] today to establish care  Current concerns include: thyroid  Hypothyroidism: reports history of thyroid disease and has taken Levothyroxine in the past. Self-discontinued due to loss of insurance. Endorses fatigue, dry skin, abnormal weight gain, and cold intolerance. Family hx significant for sister with thyroid cancer.  GERD/Barrett's esophagus: reports she was followed by Dr. Kenton Kingfisher, GI, [place], [place]. Currrently taking OTC Omeprazole.   Insomnia: currently takes 50 mg of Benadryl nightly. Reports multiple nighttime awakenings (2-3). She feels thirsty and hungry during this time and will eat carbohydrates in order to fall back to sleep. Does not feel rested.  Depression screen [facility] 2/9 06/23/2017  Decreased Interest 0  Down, Depressed, Hopeless 0  PHQ - 2 Score 0  Altered sleeping 3  Tired, decreased energy 3  Change in appetite 3  Feeling bad or failure about yourself  0  Trouble concentrating 1  Moving slowly or fidgety/restless 0  Suicidal thoughts 0  PHQ-9 Score 10    GAD 7 : Generalized Anxiety Score 06/23/2017  Nervous, Anxious, on Edge 0  Control/stop worrying 0  Worry too much - different things 1  Trouble relaxing 3  Restless 1  Easily annoyed or irritable 3  Afraid - awful might happen 0  Total GAD 7 Score 8      Past Medical History:  Diagnosis Date  . Anemia    h/o of Bld. Transfusion post C/Section  . Anxiety    panic attacks - several yrs. ago, pt. reports that she is claustrophobic     . Arthritis    shoulders- ? bursitis   . Cancer [facility])    hysterectomy for preCA of the cervix    . GERD (gastroesophageal reflux disease)   . Headache(784.0)   . Irregular heartbeat    pt. states that she has been told that she has an irreg. heartbeat on occas.  . Shortness of breath   .  Thyroid disease   . Tobacco abuse    Past Surgical History:  Procedure Laterality Date  . ABDOMINAL HYSTERECTOMY     bowel repair /w Davinci Robot procedure   . ANTERIOR CERVICAL DECOMP/DISCECTOMY FUSION N/A 01/06/2013   Procedure: ANTERIOR CERVICAL DECOMPRESSION/DISCECTOMY FUSION 2 LEVELS;  Surgeon: Sinclair Ship, MD;  Location: ;  Service: Orthopedics;  Laterality: N/A;  Anterior cervical decompression fusion, cervical 5-6, cervical 6-7 with instrumentation and allograft  . CESAREAN SECTION  1994   tubal ligation   . CHOLECYSTECTOMY N/A 09/06/2013   Procedure: LAPAROSCOPIC CHOLECYSTECTOMY WITH INTRAOPERATIVE CHOLANGIOGRAM;  Surgeon: Joyice Faster. Cornett, MD;  Location: [facility];  Service: General;  Laterality: N/A;  . DENTAL SURGERY  02-2013   implants placed  . OOPHORECTOMY    . TONSILLECTOMY     Social History   Tobacco Use  . Smoking status: Current Every Day Smoker    Packs/day: 1.00    Years: 30.00    Pack years: 30.00  . Smokeless tobacco: Never Used  Substance Use Topics  . Alcohol use: No   family  history includes Alcohol abuse in her brother; Cancer in her sister and sister; Depression in her mother; Diabetes in her maternal grandmother; Heart attack in her father; Hypertension in her father.    ROS: Review of Systems  Constitutional: Positive for malaise/fatigue.  Gastrointestinal: Positive for heartburn. Negative for blood in stool and melena.  Genitourinary:       +malodorous urine  Musculoskeletal: Positive for back pain and joint pain.  Skin:       +dry  Neurological: Positive for headaches.  Endo/Heme/Allergies: Positive for polydipsia.       + thyroid disease + weight gain  Psychiatric/Behavioral: The patient has insomnia.   All other systems reviewed and are negative.    Medications: Current Outpatient Medications  Medication Sig Dispense Refill  . diphenhydramine-acetaminophen (TYLENOL PM) 25-500 MG TABS tablet Take 1 tablet by mouth at  bedtime as needed.     No current facility-administered medications for this visit.    Allergies  Allergen Reactions  . Morphine And Related Anaphylaxis       Objective:  BP 122/85   Pulse 84   Wt 163 lb (73.9 kg)   BMI 27.12 kg/m  Gen:  alert, not ill-appearing, no distress, appropriate for age 58: head normocephalic without obvious abnormality, conjunctiva and cornea clear, thyroid symmetric, not enlarged, no tenderness or palpable nodules, trachea midline Pulm: Normal work of breathing, normal phonation Neuro: alert and oriented x 3, no tremor MSK: extremities atraumatic, normal gait and station Skin: intact, no rashes on exposed skin, no jaundice, no cyanosis Psych: well-groomed, cooperative, good eye contact, euthymic mood, affect mood-congruent, speech is articulate, and thought processes clear and goal-directed    No results found for this or any previous visit (from the past 72 hour(s)). No results found.    Assessment and Plan: 47 y.o. female with   1. Encounter to establish care - reviewed PMH, PSH, PFH, medications, allergies, health maintenance - personally reviewed labs from 06/18/17 - due for mammogram and pap smear - negative PHQ2  2. Breast cancer screening by mammogram - MM SCREENING BREAST TOMO BILATERAL; Future  3. Subclinical hypothyroidism Lab Results  Component Value Date   TSH 6.73 (H) 06/18/2017  - explained difference between subclinical and clinical hypothyroidism. She has been treated with Synthroid in the past and she is symptomatic, so we will start low-dose Synthroid 25 mcg  - TSH + free T4  4. History of cervical dysplasia - she has not had a Pap smear since her hysterectomy. Recommend Pap of vaginal cuff due to history  5. History of hysterectomy for indication other than cancer   6. Vitamin deficiency - Vitamin B12 - VITAMIN D 25 Hydroxy (Vit-D Deficiency, Fractures)  7. Barrett's esophagus determined by endoscopy - no  red flag symptoms, self-reports diagnosis of barretts by doctor in [place]. I do not have access to records. Starting PPI and referring to GI for management - Ambulatory referral to Gastroenterology - pantoprazole (PROTONIX) 40 MG tablet; Take 1 tablet (40 mg total) by mouth daily.  Dispense: 90 tablet; Refill: 1  8. Primary insomnia - stopping Benadryl and switching to Hydroxyzine - hydrOXYzine (ATARAX/VISTARIL) 25 MG tablet; Take 1-2 tablets (25-50 mg total) by mouth at bedtime.  Dispense: 40 tablet; Refill: 0  9. Arthralgia, unspecified joint - normal ESR, CBC, CMP - stopping Tylenol PM and switching to Tylenol arthritis pain. Follow-up with Dr. [name] - acetaminophen (TYLENOL) 650 MG CR tablet; Take 2 tablets (1,300 mg total) by mouth every 8 (  eight) hours as needed for pain.  Dispense: 90 tablet; Refill: 3     Patient education and anticipatory guidance given Patient agrees with treatment plan Follow-up in 6 weeks for CPE w/Pap and TSH check or sooner as needed if symptoms worsen or fail to improve  Darlyne Russian PA-C

## 2017-06-24 ENCOUNTER — Encounter: Payer: Self-pay | Admitting: Physician Assistant

## 2017-06-24 DIAGNOSIS — R632 Polyphagia: Secondary | ICD-10-CM | POA: Insufficient documentation

## 2017-06-24 DIAGNOSIS — E039 Hypothyroidism, unspecified: Secondary | ICD-10-CM | POA: Insufficient documentation

## 2017-06-24 DIAGNOSIS — Z87410 Personal history of cervical dysplasia: Secondary | ICD-10-CM | POA: Insufficient documentation

## 2017-06-24 DIAGNOSIS — Z90710 Acquired absence of both cervix and uterus: Secondary | ICD-10-CM | POA: Insufficient documentation

## 2017-06-24 DIAGNOSIS — E038 Other specified hypothyroidism: Secondary | ICD-10-CM | POA: Insufficient documentation

## 2017-06-24 DIAGNOSIS — M2550 Pain in unspecified joint: Secondary | ICD-10-CM | POA: Insufficient documentation

## 2017-07-07 ENCOUNTER — Ambulatory Visit: Payer: 59 | Admitting: Sports Medicine

## 2017-07-09 ENCOUNTER — Other Ambulatory Visit: Payer: Self-pay | Admitting: Physician Assistant

## 2017-07-09 DIAGNOSIS — F5101 Primary insomnia: Secondary | ICD-10-CM

## 2017-07-16 ENCOUNTER — Ambulatory Visit (INDEPENDENT_AMBULATORY_CARE_PROVIDER_SITE_OTHER): Payer: 59

## 2017-07-16 DIAGNOSIS — Z1231 Encounter for screening mammogram for malignant neoplasm of breast: Secondary | ICD-10-CM

## 2017-07-16 NOTE — Progress Notes (Signed)
Normal mammogram Repeat screening in 1 year

## 2017-07-21 ENCOUNTER — Ambulatory Visit: Payer: 59 | Admitting: Sports Medicine

## 2017-07-29 ENCOUNTER — Encounter: Payer: Self-pay | Admitting: Physician Assistant

## 2017-08-04 ENCOUNTER — Ambulatory Visit: Payer: 59 | Admitting: Physician Assistant

## 2017-08-04 DIAGNOSIS — Z0189 Encounter for other specified special examinations: Secondary | ICD-10-CM

## 2017-08-12 ENCOUNTER — Other Ambulatory Visit: Payer: Self-pay | Admitting: Physician Assistant

## 2017-08-12 DIAGNOSIS — F5101 Primary insomnia: Secondary | ICD-10-CM

## 2017-08-24 ENCOUNTER — Ambulatory Visit (INDEPENDENT_AMBULATORY_CARE_PROVIDER_SITE_OTHER): Payer: 59 | Admitting: Physician Assistant

## 2017-08-24 ENCOUNTER — Encounter: Payer: Self-pay | Admitting: Physician Assistant

## 2017-08-24 VITALS — BP 110/80 | HR 73 | Ht 65.0 in | Wt 158.0 lb

## 2017-08-24 DIAGNOSIS — K219 Gastro-esophageal reflux disease without esophagitis: Secondary | ICD-10-CM | POA: Diagnosis not present

## 2017-08-24 DIAGNOSIS — K2270 Barrett's esophagus without dysplasia: Secondary | ICD-10-CM | POA: Diagnosis not present

## 2017-08-24 MED ORDER — PANTOPRAZOLE SODIUM 40 MG PO TBEC: DELAYED_RELEASE_TABLET | ORAL | 11 refills | Status: DC

## 2017-08-24 NOTE — Progress Notes (Signed)
Subjective:    Patient ID: Joanne [name], female    DOB: 1970-12-14, 47 y.o.   MRN: [id number]  HPI Joanne [name] is a pleasant 47 year old white female, new to GI today referred by Nelson Chimes, PA-C for evaluation of chronic GERD and history of Barrett's by patient report.  Also having refractory reflux symptoms. She has history of obesity, previous spinal fusion, status post cholecystectomy, status post TAH, and history of depression. She relates having prior endoscopy done in [place] about 3 years ago by Dr. Ulyses Southward and says she was told that she had Barrett's esophagus.  She states she was told that she had bad acid reflux as well.  She was treated with PPI therapy at that time but at some point after that lost her insurance and was not on any medication for a couple of years. She has now been placed on Protonix over the past 3 months and says it is helping but is not controlling her symptoms completely.  She continues to have hoarseness and also complains of dryness and scratchiness in her throat and she feels that she has reflux in the evenings with fluid coming back up into the back of her mouth and also wakes up at night at times with fluid in her throat and nausea.  She has no complaints of dysphagia or odynophagia but does complain of intermittent ongoing indigestion and heartburn. I am able to see part of her procedure information in care everywhere, and there is a path report from September 2016 that shows grade a erosions in the esophagus and gastric biopsies that show reactive gastropathy and no H. pylori.  I do not see any esophageal biopsies. Patient is a smoker and is trying to quit. Family history negative for colon cancer.  Review of Systems;Pertinent positive and negative review of systems were noted in the above HPI section.  All other review of systems was otherwise negative.  Outpatient Encounter Medications as of 08/24/2017  Medication Sig  . acetaminophen  (TYLENOL) 650 MG CR tablet Take 2 tablets (1,300 mg total) by mouth every 8 (eight) hours as needed for pain.  . hydrOXYzine (ATARAX/VISTARIL) 25 MG tablet Take 1-2 tablets (25-50 mg total) by mouth at bedtime.  [facility] Kitchen levothyroxine (SYNTHROID, LEVOTHROID) 25 MCG tablet Take 1 tablet (25 mcg total) by mouth daily before breakfast.  . pantoprazole (PROTONIX) 40 MG tablet Take 1 tablet twice daily before breakfast and dinner.  . [DISCONTINUED] pantoprazole (PROTONIX) 40 MG tablet Take 1 tablet (40 mg total) by mouth daily.   No facility-administered encounter medications on file as of 08/24/2017.    Allergies  Allergen Reactions  . Morphine And Related Anaphylaxis   Patient Active Problem List   Diagnosis Date Noted  . Subclinical hypothyroidism 06/24/2017  . History of cervical dysplasia 06/24/2017  . History of hysterectomy for indication other than cancer 06/24/2017  . Arthralgia 06/24/2017  . Polyphagia 06/24/2017  . Barrett's esophagus determined by endoscopy 06/23/2017  . Esophagitis, erosive 12/19/2014  . Mass of left axilla 04/03/2014  . Excessive daytime sleepiness 03/14/2014  . Abdominal pain, epigastric 03/14/2014  . Annual physical exam 01/17/2014  . Smoker 11/17/2013  . Obesity 08/24/2013  . Right lumbar radiculitis 06/07/2013  . Venous stasis dermatitis 12/09/2012  . Insomnia 11/24/2012  . Major depression 11/24/2012  . Menopausal symptoms 11/24/2012  . Dysplasia of cervix 11/24/2012  . Degenerative disc disease, cervical 11/12/2012   Social History   Socioeconomic History  . Marital status: Single  Spouse name: Not on file  . Number of children: Not on file  . Years of education: Not on file  . Highest education level: Not on file  Occupational History  . Not on file  Social Needs  . Financial resource strain: Not on file  . Food insecurity:    Worry: Not on file    Inability: Not on file  . Transportation needs:    Medical: Not on file    Non-medical: Not  on file  Tobacco Use  . Smoking status: Current Every Day Smoker    Packs/day: 1.00    Years: 30.00    Pack years: 30.00    Types: Cigarettes  . Smokeless tobacco: Never Used  Substance and Sexual Activity  . Alcohol use: No  . Drug use: No  . Sexual activity: Not Currently  Lifestyle  . Physical activity:    Days per week: Not on file    Minutes per session: Not on file  . Stress: Not on file  Relationships  . Social connections:    Talks on phone: Not on file    Gets together: Not on file    Attends religious service: Not on file    Active member of club or organization: Not on file    Attends meetings of clubs or organizations: Not on file    Relationship status: Not on file  . Intimate partner violence:    Fear of current or ex partner: Not on file    Emotionally abused: Not on file    Physically abused: Not on file    Forced sexual activity: Not on file  Other Topics Concern  . Not on file  Social History Narrative  . Not on file    Ms. Aslinger's family history includes Alcohol abuse in her brother; Cervical cancer in her sister; Depression in her mother; Diabetes in her maternal grandmother; Heart attack in her father; Hypertension in her father; Thyroid cancer in her sister.      Objective:    Vitals:   08/24/17 0826  BP: 110/80  Pulse: 73    Physical Exam; well-developed white female in no acute distress, pleasant blood pressure 110/80 pulse 73 height 5 foot 5, weight 158, BMI 26.2.  HEENT; nontraumatic normocephalic EOMI PERRLA sclera anicteric, Oropharynx clear, Cardiovascular; regular rate and rhythm with S1-S2, Pulmonary; clear bilaterally, Abdomen; soft, nontender, bowel sounds are present there is no palpable mass or hepatosplenomegaly, Rectal ;exam not done, Extremities; no clubbing cyanosis or edema skin warm dry, Neuro psych ;alert and oriented, grossly nonfocal mood and affect appropriate       Assessment & Plan:   #9 47 year old white female  with chronic GERD-currently poorly controlled on once daily Protonix 40 mg p.o. every morning with complaints of reflux, sour brash and hoarseness as well as intermittent indigestion. #2.  Question Barrett's esophagus-prior EGD in 2016, [place] patient states she was told she had Barrett's. #3 status post cholecystectomy  #4 status post TAH #5.  Status post spinal fusion  Plan; We will obtain copies of her previous records from Dr. Juanda Crumble [facility]. Discussed a strict antireflux regimen with the patient and antireflux diet and she was provided with educational materials. Strongly advised she quit smoking We will increase Protonix to 40 mg p.o. AC breakfast and before meals dinner, and new prescription was sent. Patient will be scheduled for upper endoscopy with biopsies with Dr. Loletha Carrow.  Procedure was discussed in detail with the patient including indications risks  and benefits and she is agreeable to proceed.    Addendum; copies of patient's prior endoscopy and path obtained.  EGD was done in September 2016, esophagus showed evidence of grade a erosions at the GE junction no evidence of stricture or Schatzki's ring, mass or Barrett's change, distally in the stomach she had watermelon appearance with radial striping emanating from the pylorus no frank ulceration.  Biopsies from the stomach showed reactive changes no H. pylori.  No esophageal biopsies were done.   Korri Ask S Davona Kinoshita PA-C 08/24/2017   Cc: Ottis Stain*

## 2017-08-24 NOTE — Patient Instructions (Signed)
If you are age 48 or younger, your body mass index should be between 19-25. Your Body mass index is 26.29 kg/m. If this is out of the aformentioned range listed, please consider follow up with your Primary Care Provider.  We have provided you with Anti-reflux information. Quit smoking.  You have been scheduled for an endoscopy. Please follow written instructions given to you at your visit today. If you use inhalers (even only as needed), please bring them with you on the day of your procedure. Your physician has requested that you go to www.startemmi.com and enter the access code given to you at your visit today. This web site gives a general overview about your procedure. However, you should still follow specific instructions given to you by our office regarding your preparation for the procedure.

## 2017-08-25 NOTE — Progress Notes (Signed)
Thank you for sending this case to me. I have reviewed the entire note, and the outlined plan seems appropriate.  Records confirmed no Barrett's 2016, but she has breakthrough GERD symptoms now So EGD appropriate.  Agreed that smoking cessation essential.  Wilfrid Lund, MD

## 2017-09-14 ENCOUNTER — Encounter: Payer: Self-pay | Admitting: Gastroenterology

## 2017-09-17 ENCOUNTER — Encounter: Payer: Self-pay | Admitting: Gastroenterology

## 2017-09-17 ENCOUNTER — Telehealth: Payer: Self-pay | Admitting: Gastroenterology

## 2017-09-17 NOTE — Telephone Encounter (Signed)
(  For documentation purposes only - next late cancel  Or a no show will be a late charge)

## 2017-09-17 NOTE — Telephone Encounter (Signed)
Hi Dr. Loletha Carrow, this pt just cancelled EGD for tomorrow at 10:00am. She thought that proc was scheduled for Monday so her work gave her that day off. She has r/s for 10/27/17. Thank you

## 2017-09-18 ENCOUNTER — Encounter: Payer: 59 | Admitting: Gastroenterology

## 2017-10-21 ENCOUNTER — Other Ambulatory Visit: Payer: Self-pay | Admitting: Physician Assistant

## 2017-10-21 DIAGNOSIS — E038 Other specified hypothyroidism: Secondary | ICD-10-CM

## 2017-10-21 DIAGNOSIS — E039 Hypothyroidism, unspecified: Secondary | ICD-10-CM

## 2017-10-26 ENCOUNTER — Telehealth: Payer: Self-pay | Admitting: Gastroenterology

## 2017-10-27 ENCOUNTER — Encounter: Payer: 59 | Admitting: Gastroenterology

## 2017-11-06 ENCOUNTER — Encounter: Payer: Self-pay | Admitting: Gastroenterology

## 2017-11-06 ENCOUNTER — Ambulatory Visit (AMBULATORY_SURGERY_CENTER): Payer: 59 | Admitting: Gastroenterology

## 2017-11-06 ENCOUNTER — Ambulatory Visit: Payer: 59 | Admitting: Physician Assistant

## 2017-11-06 VITALS — BP 113/74 | HR 67 | Temp 98.0°F | Resp 17 | Ht 65.0 in | Wt 158.0 lb

## 2017-11-06 DIAGNOSIS — K219 Gastro-esophageal reflux disease without esophagitis: Secondary | ICD-10-CM

## 2017-11-06 MED ORDER — SODIUM CHLORIDE 0.9 % IV SOLN: 500.0000 mL | Freq: Once | INTRAVENOUS | Status: DC

## 2017-11-06 NOTE — Patient Instructions (Signed)
YOU HAD AN ENDOSCOPIC PROCEDURE TODAY AT THE [facility]:   Refer to the procedure report that was given to you for any specific questions about what was found during the examination.  If the procedure report does not answer your questions, please call your gastroenterologist to clarify.  If you requested that your care partner not be given the details of your procedure findings, then the procedure report has been included in a sealed envelope for you to review at your convenience later.  YOU SHOULD EXPECT: Some feelings of bloating in the abdomen. Passage of more gas than usual.  Walking can help get rid of the air that was put into your GI tract during the procedure and reduce the bloating. If you had a lower endoscopy (such as a colonoscopy or flexible sigmoidoscopy) you may notice spotting of blood in your stool or on the toilet paper. If you underwent a bowel prep for your procedure, you may not have a normal bowel movement for a few days.  Please Note:  You might notice some irritation and congestion in your nose or some drainage.  This is from the oxygen used during your procedure.  There is no need for concern and it should clear up in a day or so.  SYMPTOMS TO REPORT IMMEDIATELY:   Following upper endoscopy (EGD)  Vomiting of blood or coffee ground material  New chest pain or pain under the shoulder blades  Painful or persistently difficult swallowing  New shortness of breath  Fever of 100F or higher  Black, tarry-looking stools  For urgent or emergent issues, a gastroenterologist can be reached at any hour by calling [phone].   DIET:  We do recommend a small meal at first, but then you may proceed to your regular diet.  Drink plenty of fluids but you should avoid alcoholic beverages for 24 hours.  ACTIVITY:  You should plan to take it easy for the rest of today and you should NOT DRIVE or use heavy machinery until tomorrow (because of the sedation medicines used  during the test).    FOLLOW UP: Our staff will call the number listed on your records the next business day following your procedure to check on you and address any questions or concerns that you may have regarding the information given to you following your procedure. If we do not reach you, we will leave a message.  However, if you are feeling well and you are not experiencing any problems, there is no need to return our call.  We will assume that you have returned to your regular daily activities without incident.  If any biopsies were taken you will be contacted by phone or by letter within the next 1-3 weeks.  Please call us at [phone] if you have not heard about the biopsies in 3 weeks.    SIGNATURES/CONFIDENTIALITY: You and/or your care partner have signed paperwork which will be entered into your electronic medical record.  These signatures attest to the fact that that the information above on your After Visit Summary has been reviewed and is understood.  Full responsibility of the confidentiality of this discharge information lies with you and/or your care-partner. 

## 2017-11-06 NOTE — Progress Notes (Signed)
Report given to PACU, vss 

## 2017-11-06 NOTE — Progress Notes (Signed)
Called to room to assist during endoscopic procedure.  Patient ID and intended procedure confirmed with present staff. Received instructions for my participation in the procedure from the performing physician.  

## 2017-11-06 NOTE — Op Note (Signed)
[name] Procedure Date: 11/06/2017 8:32 AM MRN: [id number] Endoscopist: Mallie Mussel L. Joanne [name] , MD Age: 47 Referring MD:  Date of Birth: 06/27/1970 Gender: Female Account #: [id number] Procedure:                Upper GI endoscopy Indications:              Esophageal reflux symptoms that persist despite                            appropriate therapy Medicines:                Monitored Anesthesia Care Procedure:                Pre-Anesthesia Assessment:                           - Prior to the procedure, a History and Physical                            was performed, and patient medications and                            allergies were reviewed. The patient's tolerance of                            previous anesthesia was also reviewed. The risks                            and benefits of the procedure and the sedation                            options and risks were discussed with the patient.                            All questions were answered, and informed consent                            was obtained. Prior Anticoagulants: The patient has                            taken no previous anticoagulant or antiplatelet                            agents. ASA Grade Assessment: II - A patient with                            mild systemic disease. After reviewing the risks                            and benefits, the patient was deemed in                            satisfactory condition to undergo the procedure.  After obtaining informed consent, the endoscope was                            passed under direct vision. Throughout the                            procedure, the patient's blood pressure, pulse, and                            oxygen saturations were monitored continuously. The                            Model GIF-[id number]) scope was introduced                            through the mouth, and advanced to  the second part                            of duodenum. The upper GI endoscopy was                            accomplished without difficulty. The patient                            tolerated the procedure well. Scope In: Scope Out: Findings:                 One tongue of salmon-colored mucosa was present at                            38 cm. No other visible abnormalities were present.                            The maximum longitudinal extent of these esophageal                            mucosal changes was 1 cm in length. Biopsies were                            taken with a cold forceps for histology.                           The exam of the esophagus was otherwise normal.                           The stomach was normal.                           The cardia and gastric fundus were normal on                            retroflexion.                           The examined duodenum was normal. Complications:  No immediate complications. Estimated Blood Loss:     Estimated blood loss was minimal. Impression:               - Salmon-colored mucosa. Biopsied.                           - Normal stomach.                           - Normal examined duodenum. Recommendation:           - Patient has a contact number available for                            emergencies. The signs and symptoms of potential                            delayed complications were discussed with the                            patient. Return to normal activities tomorrow.                            Written discharge instructions were provided to the                            patient.                           - Resume previous diet.                           - Continue present medications.                           - Await pathology results.                           - Follow an antireflux regimen indefinitely.                           - Discontinue the use of any products containing                             nicotine. Henry L. Joanne Carrow, MD 11/06/2017 8:48:45 AM This report has been signed electronically.

## 2017-11-09 ENCOUNTER — Other Ambulatory Visit: Payer: Self-pay

## 2017-11-09 ENCOUNTER — Telehealth: Payer: Self-pay | Admitting: *Deleted

## 2017-11-09 ENCOUNTER — Encounter: Payer: Self-pay | Admitting: *Deleted

## 2017-11-09 ENCOUNTER — Emergency Department (INDEPENDENT_AMBULATORY_CARE_PROVIDER_SITE_OTHER)
Admission: EM | Admit: 2017-11-09 | Discharge: 2017-11-09 | Disposition: A | Discharge status: Home / Self Care | Payer: 59 | Source: Home / Self Care | Attending: Family Medicine | Admitting: Family Medicine

## 2017-11-09 DIAGNOSIS — R300 Dysuria: Secondary | ICD-10-CM | POA: Diagnosis not present

## 2017-11-09 DIAGNOSIS — E039 Hypothyroidism, unspecified: Secondary | ICD-10-CM | POA: Diagnosis not present

## 2017-11-09 DIAGNOSIS — E038 Other specified hypothyroidism: Secondary | ICD-10-CM

## 2017-11-09 DIAGNOSIS — Z760 Encounter for issue of repeat prescription: Secondary | ICD-10-CM

## 2017-11-09 LAB — POCT URINALYSIS DIP (MANUAL ENTRY)
Bilirubin, UA: NEGATIVE
Blood, UA: NEGATIVE
Glucose, UA: NEGATIVE mg/dL
NITRITE UA: NEGATIVE
Spec Grav, UA: 1.025 (ref 1.010–1.025)
UROBILINOGEN UA: 1 U/dL
pH, UA: 6 (ref 5.0–8.0)

## 2017-11-09 MED ORDER — LEVOTHYROXINE SODIUM 25 MCG PO TABS: 25.0000 ug | ORAL_TABLET | Freq: Every day | ORAL | 0 refills | Status: DC

## 2017-11-09 NOTE — ED Provider Notes (Signed)
Joanne [name] CARE    CSN: [id number] Arrival date & time: 11/09/17  1209     History   Chief Complaint Chief Complaint  Patient presents with  . Dysuria  . Thyroid Problem    HPI Joanne [name] is a 47 y.o. female.   HPI Joanne [name] is a 47 y.o. female presenting to UC with c/o 2 weeks of dysuria, flank pain and lower pelvic pain.  Denies vaginal discharge or bleeding but did have unprotected intercourse around same time symptoms started. No specific known exposure to STD.  Denies fever, chills, n/v/d.   She is also requesting a refill on her levothyroxine.  She had a f/u appointment but it was canceled by the doctor's office 3 days ago.  She is concerned she may need a higher dose of medication due to feeling fatigued. She has not had her TSH checked in several months.    Past Medical History:  Diagnosis Date  . Anemia    h/o of Bld. Transfusion post C/Section  . Anxiety    panic attacks - several yrs. ago, pt. reports that she is claustrophobic     . Arthritis    shoulders- ? bursitis   . Cancer [facility])    hysterectomy for preCA of the cervix    . Cervical dysplasia   . GERD (gastroesophageal reflux disease)   . Headache(784.0)   . Irregular heartbeat    pt. states that she has been told that she has an irreg. heartbeat on occas.  . Ovarian cyst   . Shortness of breath   . Thyroid disease   . Tobacco abuse     Patient Active Problem List   Diagnosis Date Noted  . Subclinical hypothyroidism 06/24/2017  . History of cervical dysplasia 06/24/2017  . History of hysterectomy for indication other than cancer 06/24/2017  . Arthralgia 06/24/2017  . Polyphagia 06/24/2017  . Barrett's esophagus determined by endoscopy 06/23/2017  . Esophagitis, erosive 12/19/2014  . Mass of left axilla 04/03/2014  . Excessive daytime sleepiness 03/14/2014  . Abdominal pain, epigastric 03/14/2014  . Annual physical exam 01/17/2014  . Smoker 11/17/2013  . Obesity  08/24/2013  . Right lumbar radiculitis 06/07/2013  . Venous stasis dermatitis 12/09/2012  . Insomnia 11/24/2012  . Major depression 11/24/2012  . Menopausal symptoms 11/24/2012  . Dysplasia of cervix 11/24/2012  . Degenerative disc disease, cervical 11/12/2012    Past Surgical History:  Procedure Laterality Date  . ABDOMINAL HYSTERECTOMY     bowel repair /w Davinci Robot procedure   . ANTERIOR CERVICAL DECOMP/DISCECTOMY FUSION N/A 01/06/2013   Procedure: ANTERIOR CERVICAL DECOMPRESSION/DISCECTOMY FUSION 2 LEVELS;  Surgeon: Sinclair Ship, MD;  Location: [place];  Service: Orthopedics;  Laterality: N/A;  Anterior cervical decompression fusion, cervical 5-6, cervical 6-7 with instrumentation and allograft  . CESAREAN SECTION  1994   tubal ligation   . CHOLECYSTECTOMY N/A 09/06/2013   Procedure: LAPAROSCOPIC CHOLECYSTECTOMY WITH INTRAOPERATIVE CHOLANGIOGRAM;  Surgeon: Joyice Faster. Cornett, MD;  Location: [place];  Service: General;  Laterality: N/A;  . DENTAL SURGERY  02-2013   implants placed  . OOPHORECTOMY    . TONSILLECTOMY      OB History    Gravida  2   Para  2   Term      Preterm      AB      Living        SAB      TAB      Ectopic  Multiple      Live Births               Home Medications    Prior to Admission medications   Medication Sig Start Date End Date Taking? Authorizing Provider  acetaminophen (TYLENOL) 650 MG CR tablet Take 2 tablets (1,300 mg total) by mouth every 8 (eight) hours as needed for pain. 06/23/17  Yes Trixie Dredge, PA-C  pantoprazole (PROTONIX) 40 MG tablet Take 1 tablet twice daily before breakfast and dinner. 08/24/17  Yes Esterwood, Amy S, PA-C  hydrOXYzine (ATARAX/VISTARIL) 25 MG tablet Take 1-2 tablets (25-50 mg total) by mouth at bedtime. 08/13/17   Trixie Dredge, PA-C  levothyroxine (SYNTHROID, LEVOTHROID) 25 MCG tablet Take 1 tablet (25 mcg total) by mouth daily before breakfast. 11/09/17    Noe Gens, PA-C    Family History Family History  Problem Relation Age of Onset  . Depression Mother   . Heart attack Father   . Hypertension Father   . Cervical cancer Sister   . Alcohol abuse Brother   . Diabetes Maternal Grandmother   . Thyroid cancer Sister     Social History Social History   Tobacco Use  . Smoking status: Current Every Day Smoker    Packs/day: 1.00    Years: 30.00    Pack years: 30.00    Types: Cigarettes  . Smokeless tobacco: Never Used  Substance Use Topics  . Alcohol use: No  . Drug use: No     Allergies   Morphine and related   Review of Systems Review of Systems  Constitutional: Positive for fatigue. Negative for chills and fever.  Cardiovascular: Negative for chest pain and palpitations.  Gastrointestinal: Positive for abdominal pain. Negative for diarrhea, nausea and vomiting.  Genitourinary: Positive for dysuria, flank pain and pelvic pain. Negative for frequency, hematuria, urgency, vaginal bleeding, vaginal discharge and vaginal pain.  Musculoskeletal: Positive for back pain. Negative for myalgias.  Neurological: Negative for dizziness, light-headedness and headaches.     Physical Exam Triage Vital Signs ED Triage Vitals  Enc Vitals Group     BP 11/09/17 1232 122/76     Pulse Rate 11/09/17 1232 69     Resp 11/09/17 1232 14     Temp 11/09/17 1232 98.2 F (36.8 C)     Temp Source 11/09/17 1232 Oral     SpO2 11/09/17 1232 98 %     Weight 11/09/17 1233 160 lb (72.6 kg)     Height --      Head Circumference --      Peak Flow --      Pain Score 11/09/17 1232 0     Pain Loc --      Pain Edu? --      Excl. in [place]? --    No data found.  Updated Vital Signs BP 122/76 (BP Location: Right Arm)   Pulse 69   Temp 98.2 F (36.8 C) (Oral)   Resp 14   Wt 160 lb (72.6 kg)   SpO2 98%   BMI 26.63 kg/m   Visual Acuity Right Eye Distance:   Left Eye Distance:   Bilateral Distance:    Right Eye Near:   Left Eye Near:      Bilateral Near:     Physical Exam  Constitutional: She is oriented to person, place, and time. She appears well-developed and well-nourished.  HENT:  Head: Normocephalic and atraumatic.  Mouth/Throat: Oropharynx is clear and moist.  Eyes: EOM  are normal.  Neck: Normal range of motion. Neck supple. No thyromegaly present.  Cardiovascular: Normal rate and regular rhythm.  Pulmonary/Chest: Effort normal and breath sounds normal. No stridor. No respiratory distress. She has no wheezes. She has no rales.  Abdominal: Soft. There is no tenderness. There is no CVA tenderness.  Musculoskeletal: Normal range of motion.  Neurological: She is alert and oriented to person, place, and time.  Skin: Skin is warm and dry.  Psychiatric: She has a normal mood and affect. Her behavior is normal.  Nursing note and vitals reviewed.    UC Treatments / Results  Labs (all labs ordered are listed, but only abnormal results are displayed) Labs Reviewed  POCT URINALYSIS DIP (MANUAL ENTRY) - Abnormal; Notable for the following components:      Result Value   Ketones, POC UA trace (5) (*)    Protein Ur, POC trace (*)    Leukocytes, UA Trace (*)    All other components within normal limits  C. TRACHOMATIS/N. GONORRHOEAE RNA  URINE CULTURE  TSH  RPR  HIV ANTIBODY (ROUTINE TESTING)    EKG None  Radiology No results found.  Procedures Procedures (including critical care time)  Medications Ordered in UC Medications - No data to display  Initial Impression / Assessment and Plan / UC Course  I have reviewed the triage vital signs and the nursing notes.  Pertinent labs & imaging results that were available during my care of the patient were reviewed by me and considered in my medical decision making (see chart for details).     UA unremarkable No evidence of UTI STI labs sent  Pt's thyroid medication refilled for 2 weeks while TSH lab pending.   Final Clinical Impressions(s) / UC Diagnoses    Final diagnoses:  Dysuria  Other specified hypothyroidism  Medication refill     Discharge Instructions      You will be notified of your test results in about 3-4 days.  If any additional medication is indicated, it can be called into your preferred pharmacy.  Please be sure to schedule a follow up appointment with your family doctor of ongoing healthcare needs including medication refills.     ED Prescriptions    Medication Sig Dispense Auth. Provider   levothyroxine (SYNTHROID, LEVOTHROID) 25 MCG tablet Take 1 tablet (25 mcg total) by mouth daily before breakfast. 14 tablet Noe Gens, PA-C     Controlled Substance Prescriptions [place] Controlled Substance Registry consulted? Not Applicable   Tyrell Antonio 11/10/17 1143

## 2017-11-09 NOTE — ED Triage Notes (Signed)
Patient c/o 2 weeks of dysuria and flank pain with periods of lower pelvic pain. Denies vaginal discharge. Reports she has unprotected sex around the same time.  Also, her appointment to refill her levothyroxine was cancelled 3 days ago. She is asking for a refill on her medication.

## 2017-11-09 NOTE — Discharge Instructions (Signed)
°  You will be notified of your test results in about 3-4 days.  If any additional medication is indicated, it can be called into your preferred pharmacy.  Please be sure to schedule a follow up appointment with your family doctor of ongoing healthcare needs including medication refills.

## 2017-11-09 NOTE — Telephone Encounter (Signed)
  Follow up Call-  Call back number 11/06/2017  Post procedure Call Back phone  # [phone]  Permission to leave phone message Yes  Some recent data might be hidden     Patient questions:  Do you have a fever, pain , or abdominal swelling? No. Pain Score  0 *  Have you tolerated food without any problems? Yes.    Have you been able to return to your normal activities? Yes.    Do you have any questions about your discharge instructions: Diet   No. Medications  No. Follow up visit  No.  Do you have questions or concerns about your Care? No.  Actions: * If pain score is 4 or above: No action needed, pain <4.

## 2017-11-10 LAB — RPR: RPR Ser Ql: NONREACTIVE

## 2017-11-10 LAB — C. TRACHOMATIS/N. GONORRHOEAE RNA
C. trachomatis RNA, TMA: NOT DETECTED
N. gonorrhoeae RNA, TMA: NOT DETECTED

## 2017-11-10 LAB — HIV ANTIBODY (ROUTINE TESTING W REFLEX): HIV 1&2 Ab, 4th Generation: NONREACTIVE

## 2017-11-10 LAB — TSH: TSH: 4 mIU/L

## 2017-11-11 ENCOUNTER — Telehealth: Payer: Self-pay | Admitting: Emergency Medicine

## 2017-11-11 ENCOUNTER — Telehealth: Payer: Self-pay | Admitting: Family Medicine

## 2017-11-11 LAB — URINE CULTURE
MICRO NUMBER: 90925042
SPECIMEN QUALITY: ADEQUATE

## 2017-11-11 MED ORDER — CEPHALEXIN 500 MG PO CAPS: 500.0000 mg | ORAL_CAPSULE | Freq: Two times a day (BID) | ORAL | 0 refills | Status: DC

## 2017-11-11 NOTE — Telephone Encounter (Signed)
Urine culture positive for e. Coli. Begin Keflex 500mg  BID for one week.

## 2017-11-12 NOTE — Telephone Encounter (Signed)
Callback: Patient password received. 1971. STD results given.

## 2017-11-13 ENCOUNTER — Encounter: Payer: Self-pay | Admitting: Gastroenterology

## 2018-04-02 ENCOUNTER — Encounter: Payer: Self-pay | Admitting: Physician Assistant

## 2018-04-02 ENCOUNTER — Ambulatory Visit (INDEPENDENT_AMBULATORY_CARE_PROVIDER_SITE_OTHER): Payer: 59 | Admitting: Physician Assistant

## 2018-04-02 ENCOUNTER — Ambulatory Visit (INDEPENDENT_AMBULATORY_CARE_PROVIDER_SITE_OTHER): Payer: 59

## 2018-04-02 VITALS — BP 129/84 | HR 74 | Temp 97.8°F | Wt 156.0 lb

## 2018-04-02 DIAGNOSIS — Z801 Family history of malignant neoplasm of trachea, bronchus and lung: Secondary | ICD-10-CM

## 2018-04-02 DIAGNOSIS — E038 Other specified hypothyroidism: Secondary | ICD-10-CM

## 2018-04-02 DIAGNOSIS — R0602 Shortness of breath: Secondary | ICD-10-CM

## 2018-04-02 DIAGNOSIS — R2232 Localized swelling, mass and lump, left upper limb: Secondary | ICD-10-CM

## 2018-04-02 DIAGNOSIS — F17200 Nicotine dependence, unspecified, uncomplicated: Secondary | ICD-10-CM | POA: Diagnosis not present

## 2018-04-02 DIAGNOSIS — R634 Abnormal weight loss: Secondary | ICD-10-CM

## 2018-04-02 DIAGNOSIS — E039 Hypothyroidism, unspecified: Secondary | ICD-10-CM | POA: Diagnosis not present

## 2018-04-02 DIAGNOSIS — R0789 Other chest pain: Secondary | ICD-10-CM | POA: Diagnosis not present

## 2018-04-02 NOTE — Progress Notes (Signed)
HPI:                                                                Joanne [name] is a 47 y.o. female who presents to [name] today for left axilla mass  Reports chronic lump in her left axilla present for approx 3-4 years. Denies tenderness, firmness, warmth or drainage. Denies any large lymph nodes elsewhere. No breast symptoms. Mammogram UTD.  She also states she has lost 30 pounds in the last year, unintentionally. She smokes 1 ppd, approx 30 years. Denies fever, cough, hemoptysis. Endorses chills and cold intolerance. She has had chronic nightsweats ever since her hysterectomy.     Past Medical History:  Diagnosis Date  . Anemia    h/o of Bld. Transfusion post C/Section  . Anxiety    panic attacks - several yrs. ago, pt. reports that she is claustrophobic     . Arthritis    shoulders- ? bursitis   . Cervical dysplasia   . GERD (gastroesophageal reflux disease)   . Headache(784.0)   . Irregular heartbeat    pt. states that she has been told that she has an irreg. heartbeat on occas.  . Ovarian cyst   . Shortness of breath   . Thyroid disease   . Tobacco abuse    Past Surgical History:  Procedure Laterality Date  . ABDOMINAL HYSTERECTOMY     bowel repair /w Davinci Robot procedure   . ANTERIOR CERVICAL DECOMP/DISCECTOMY FUSION N/A 01/06/2013   Procedure: ANTERIOR CERVICAL DECOMPRESSION/DISCECTOMY FUSION 2 LEVELS;  Surgeon: Sinclair Ship, MD;  Location: [facility];  Service: Orthopedics;  Laterality: N/A;  Anterior cervical decompression fusion, cervical 5-6, cervical 6-7 with instrumentation and allograft  . CESAREAN SECTION  1994   tubal ligation   . CHOLECYSTECTOMY N/A 09/06/2013   Procedure: LAPAROSCOPIC CHOLECYSTECTOMY WITH INTRAOPERATIVE CHOLANGIOGRAM;  Surgeon: Joyice Faster. Cornett, MD;  Location: [place];  Service: General;  Laterality: N/A;  . DENTAL SURGERY  02-2013   implants placed  . OOPHORECTOMY    .  TONSILLECTOMY     Social History   Tobacco Use  . Smoking status: Current Every Day Smoker    Packs/day: 1.00    Years: 30.00    Pack years: 30.00    Types: Cigarettes  . Smokeless tobacco: Never Used  Substance Use Topics  . Alcohol use: No   family history includes Alcohol abuse in her brother; Cervical cancer in her sister; Depression in her mother; Diabetes in her maternal grandmother; Heart attack in her father; Hypertension in her father; Lung cancer in her father; Thyroid cancer in her sister.    ROS: Review of Systems  Constitutional: Positive for chills, diaphoresis (nightsweats) and weight loss.  All other systems reviewed and are negative.    Medications: Current Outpatient Medications  Medication Sig Dispense Refill  . acetaminophen (TYLENOL) 650 MG CR tablet Take 2 tablets (1,300 mg total) by mouth every 8 (eight) hours as needed for pain. 90 tablet 3  . hydrOXYzine (ATARAX/VISTARIL) 25 MG tablet Take 1-2 tablets (25-50 mg total) by mouth at bedtime. 40 tablet 0  . levothyroxine (SYNTHROID, LEVOTHROID) 25 MCG tablet Take 1 tablet (25 mcg total) by mouth daily before breakfast. 14 tablet  0  . pantoprazole (PROTONIX) 40 MG tablet Take 1 tablet twice daily before breakfast and dinner. 60 tablet 11   Current Facility-Administered Medications  Medication Dose Route Frequency Provider Last Rate Last Dose  . 0.9 %  sodium chloride infusion  500 mL Intravenous Once Doran Stabler, MD       Allergies  Allergen Reactions  . Morphine And Related Anaphylaxis       Objective:  BP 129/84   Pulse 74   Temp 97.8 F (36.6 C) (Oral)   Wt 156 lb (70.8 kg)   BMI 25.96 kg/m  Gen:  alert, not ill-appearing, no distress, appropriate for age 59: head normocephalic without obvious abnormality, conjunctiva and cornea clear, no thyromegaly or tenderness, trachea midline Pulm: Normal work of breathing, normal phonation, clear to auscultation bilaterally, no wheezes,  rales or rhonchi CV: Normal rate, regular rhythm, s1 and s2 distinct, no murmurs, clicks or rubs  Neuro: alert and oriented x 3, no tremor MSK: extremities atraumatic, normal gait and station Skin: intact, no rashes on exposed skin, no jaundice, no cyanosis Lymph: probably palpable axillary lymph node measuring approx 1 cm in the lateral chain of the left axilla; no cervical or supraclavicular adenopathy Psych: well-groomed, cooperative, good eye contact, euthymic mood, affect mood-congruent, speech is articulate, and thought processes clear and goal-directed  Wt Readings from Last 3 Encounters:  04/02/18 156 lb (70.8 kg)  11/09/17 160 lb (72.6 kg)  11/06/17 158 lb (71.7 kg)    Lab Results  Component Value Date   CREATININE 0.56 06/18/2017   BUN 9 06/18/2017   NA 138 06/18/2017   K 4.8 06/18/2017   CL 104 06/18/2017   CO2 26 06/18/2017   Lab Results  Component Value Date   TSH 4.00 11/09/2017    Lab Results  Component Value Date   WBC 5.6 06/18/2017   HGB 14.7 06/18/2017   HCT 42.0 06/18/2017   MCV 90.5 06/18/2017   PLT 186 06/18/2017      Assessment and Plan: 47 y.o. female with   .Diagnoses and all orders for this visit:  Axillary mass, left -     [place] AXILLA LEFT; Future  Abnormal weight loss -     CBC with Differential/Platelet -     COMPLETE METABOLIC PANEL WITH GFR -     TSH + free T4 -     DG Chest 2 View -     Sedimentation rate -     Urinalysis w microscopic + reflex cultur  Subclinical hypothyroidism -     TSH + free T4  Tobacco use disorder  Family history of lung cancer  Other orders -     Cancel: CT CHEST LUNG CA SCREEN LOW DOSE W/O CM; Future   Axillary mass, left - lymph node versus mass - [place] axilla pending - mammo UTD, no breast symptoms  Abnormal weight loss - in reviewing flowsheets, weight has actually been stable between 156-163 over this year. She did lose approx 20 pounds between 2015 and 2019. She has no other constitutional  symptoms apart from postmenopausal vasomotor instability. I don't think this is pathologic weight loss - given tobacco use, will work-up with CXR and CBC as well as weight loss labs (CMP, TSH, ESR, UA). Negative HIV test 4 months ago.    Patient education and anticipatory guidance given Patient agrees with treatment plan Follow-up as needed if symptoms worsen or fail to improve  Darlyne Russian PA-C

## 2018-04-04 ENCOUNTER — Encounter: Payer: Self-pay | Admitting: Physician Assistant

## 2018-04-05 ENCOUNTER — Other Ambulatory Visit: Payer: Self-pay | Admitting: Physician Assistant

## 2018-04-05 DIAGNOSIS — R2232 Localized swelling, mass and lump, left upper limb: Secondary | ICD-10-CM

## 2018-04-15 ENCOUNTER — Ambulatory Visit
Admission: RE | Admit: 2018-04-15 | Discharge: 2018-04-15 | Disposition: A | Discharge status: Home / Self Care | Payer: 59 | Source: Ambulatory Visit | Attending: Physician Assistant | Admitting: Physician Assistant

## 2018-04-15 DIAGNOSIS — R2232 Localized swelling, mass and lump, left upper limb: Secondary | ICD-10-CM

## 2018-04-26 ENCOUNTER — Other Ambulatory Visit: Payer: Self-pay | Admitting: Physician Assistant

## 2018-04-26 DIAGNOSIS — E039 Hypothyroidism, unspecified: Secondary | ICD-10-CM

## 2018-04-26 DIAGNOSIS — E038 Other specified hypothyroidism: Secondary | ICD-10-CM

## 2018-05-20 ENCOUNTER — Encounter: Payer: Self-pay | Admitting: Sports Medicine

## 2018-05-20 ENCOUNTER — Ambulatory Visit (INDEPENDENT_AMBULATORY_CARE_PROVIDER_SITE_OTHER): Payer: 59

## 2018-05-20 ENCOUNTER — Ambulatory Visit (INDEPENDENT_AMBULATORY_CARE_PROVIDER_SITE_OTHER): Payer: 59 | Admitting: Sports Medicine

## 2018-05-20 DIAGNOSIS — M25561 Pain in right knee: Secondary | ICD-10-CM

## 2018-05-20 DIAGNOSIS — M25562 Pain in left knee: Secondary | ICD-10-CM

## 2018-05-20 DIAGNOSIS — M1288 Other specific arthropathies, not elsewhere classified, other specified site: Secondary | ICD-10-CM

## 2018-05-20 DIAGNOSIS — G8929 Other chronic pain: Secondary | ICD-10-CM

## 2018-05-20 DIAGNOSIS — M533 Sacrococcygeal disorders, not elsewhere classified: Secondary | ICD-10-CM

## 2018-05-20 MED ORDER — MELOXICAM 15 MG PO TABS
ORAL_TABLET | ORAL | 3 refills | Status: DC
Start: 2018-05-20 — End: 2018-08-24

## 2018-05-20 MED ORDER — PREDNISONE 50 MG PO TABS: ORAL_TABLET | ORAL | 0 refills | Status: DC

## 2018-05-20 NOTE — Assessment & Plan Note (Signed)
Had a fall sometime ago, getting x-rays for baseline.

## 2018-05-20 NOTE — Progress Notes (Addendum)
Subjective:    CC: Low back pain  HPI:  Joanne [name] is a pleasant 48 year old female for the past few days she is had worsening pain in the right side of the low back, worse with weightbearing, feels a sensation of instability.  She does have a history of lumbar DDD, possible right L5 nerve root impingement, she was getting L5 epidurals with Dr. Earley Favor with [facility] before the practice closed.  No bowel or bladder dysfunction, saddle numbness, constitutional symptoms.  Nothing radicular, pain is all axial.  I reviewed the past medical history, family history, social history, surgical history, and allergies today and no changes were needed.  Please see the problem list section below in epic for further details.  Past Medical History: Past Medical History:  Diagnosis Date  . Anemia    h/o of Bld. Transfusion post C/Section  . Anxiety    panic attacks - several yrs. ago, pt. reports that she is claustrophobic     . Arthritis    shoulders- ? bursitis   . Cervical dysplasia   . GERD (gastroesophageal reflux disease)   . Headache(784.0)   . Irregular heartbeat    pt. states that she has been told that she has an irreg. heartbeat on occas.  . Ovarian cyst   . Shortness of breath   . Thyroid disease   . Tobacco abuse    Past Surgical History: Past Surgical History:  Procedure Laterality Date  . ABDOMINAL HYSTERECTOMY     bowel repair /w Davinci Robot procedure   . ANTERIOR CERVICAL DECOMP/DISCECTOMY FUSION N/A 01/06/2013   Procedure: ANTERIOR CERVICAL DECOMPRESSION/DISCECTOMY FUSION 2 LEVELS;  Surgeon: Sinclair Ship, MD;  Location: [place];  Service: Orthopedics;  Laterality: N/A;  Anterior cervical decompression fusion, cervical 5-6, cervical 6-7 with instrumentation and allograft  . CESAREAN SECTION  1994   tubal ligation   . CHOLECYSTECTOMY N/A 09/06/2013   Procedure: LAPAROSCOPIC CHOLECYSTECTOMY WITH INTRAOPERATIVE CHOLANGIOGRAM;  Surgeon: Joyice Faster. Cornett, MD;  Location: [facility];   Service: General;  Laterality: N/A;  . DENTAL SURGERY  02-2013   implants placed  . OOPHORECTOMY    . TONSILLECTOMY     Social History: Social History   Socioeconomic History  . Marital status: Single    Spouse name: Not on file  . Number of children: Not on file  . Years of education: Not on file  . Highest education level: Not on file  Occupational History  . Not on file  Social Needs  . Financial resource strain: Not on file  . Food insecurity:    Worry: Not on file    Inability: Not on file  . Transportation needs:    Medical: Not on file    Non-medical: Not on file  Tobacco Use  . Smoking status: Current Every Day Smoker    Packs/day: 1.00    Years: 30.00    Pack years: 30.00    Types: Cigarettes  . Smokeless tobacco: Never Used  Substance and Sexual Activity  . Alcohol use: No  . Drug use: No  . Sexual activity: Not Currently  Lifestyle  . Physical activity:    Days per week: Not on file    Minutes per session: Not on file  . Stress: Not on file  Relationships  . Social connections:    Talks on phone: Not on file    Gets together: Not on file    Attends religious service: Not on file    Active member of club or  organization: Not on file    Attends meetings of clubs or organizations: Not on file    Relationship status: Not on file  Other Topics Concern  . Not on file  Social History Narrative  . Not on file   Family History: Family History  Problem Relation Age of Onset  . Depression Mother   . Heart attack Father   . Hypertension Father   . Lung cancer Father   . Cervical cancer Sister   . Alcohol abuse Brother   . Diabetes Maternal Grandmother   . Thyroid cancer Sister    Allergies: Allergies  Allergen Reactions  . Morphine And Related Anaphylaxis   Medications: See med rec.  Review of Systems: No headache, visual changes, nausea, vomiting, diarrhea, constipation, dizziness, abdominal pain, skin rash, fevers, chills, night sweats,  swollen lymph nodes, weight loss, chest pain, body aches, joint swelling, muscle aches, shortness of breath, mood changes, visual or auditory hallucinations.  Objective:    General: Well Developed, well nourished, and in no acute distress.  Neuro: Alert and oriented x3, extra-ocular muscles intact, sensation grossly intact.  HEENT: Normocephalic, atraumatic, pupils equal round reactive to light, neck supple, no masses, no lymphadenopathy, thyroid nonpalpable.  Skin: Warm and dry, no rashes noted.  Cardiac: Regular rate and rhythm, no murmurs rubs or gallops.  Respiratory: Clear to auscultation bilaterally. Not using accessory muscles, speaking in full sentences.  Abdominal: Soft, nontender, nondistended, positive bowel sounds, no masses, no organomegaly.  Back Exam:  Inspection: Unremarkable  Motion: Flexion 45 deg, Extension 45 deg, Side Bending to 45 deg bilaterally,  Rotation to 45 deg bilaterally  SLR laying: Negative  XSLR laying: Negative  Palpable tenderness: Severe at the right sacroiliac joint. FABER: negative. Sensory change: Gross sensation intact to all lumbar and sacral dermatomes.  Reflexes: 2+ at both patellar tendons, 2+ at achilles tendons, Babinski's downgoing.  Strength at foot  Plantar-flexion: 5/5 Dorsi-flexion: 5/5 Eversion: 5/5 Inversion: 5/5  Leg strength  Quad: 5/5 Hamstring: 5/5 Hip flexor: 5/5 Hip abductors: 5/5  Gait unremarkable.  Impression and Recommendations:    The patient was counselled, risk factors were discussed, anticipatory guidance given.  Pain of right sacroiliac joint We will start conservatively, prednisone, meloxicam. X-rays of the lumbar spine and SI joint. Formal PT. Return to see me in 1 month, SI joint injection of the right if no better.  Right knee pain Had a fall sometime ago, getting x-rays for baseline. ___________________________________________ Gwen Her. [name], M.D., ABFM., CAQSM. Primary Care and Sports  Medicine [facility]  Adjunct Professor of [employer]

## 2018-05-20 NOTE — Addendum Note (Signed)
Addended by: Silverio Decamp on: 05/20/2018 03:19 PM   Modules accepted: Orders

## 2018-05-20 NOTE — Assessment & Plan Note (Signed)
We will start conservatively, prednisone, meloxicam. X-rays of the lumbar spine and SI joint. Formal PT. Return to see me in 1 month, SI joint injection of the right if no better.

## 2018-05-31 ENCOUNTER — Other Ambulatory Visit: Payer: Self-pay

## 2018-05-31 DIAGNOSIS — E039 Hypothyroidism, unspecified: Secondary | ICD-10-CM

## 2018-05-31 DIAGNOSIS — E038 Other specified hypothyroidism: Secondary | ICD-10-CM

## 2018-05-31 MED ORDER — LEVOTHYROXINE SODIUM 25 MCG PO TABS: 25.0000 ug | ORAL_TABLET | Freq: Every day | ORAL | 0 refills | Status: DC

## 2018-06-02 ENCOUNTER — Other Ambulatory Visit: Payer: Self-pay

## 2018-06-02 DIAGNOSIS — K2270 Barrett's esophagus without dysplasia: Secondary | ICD-10-CM

## 2018-06-02 MED ORDER — PANTOPRAZOLE SODIUM 40 MG PO TBEC: 40.0000 mg | DELAYED_RELEASE_TABLET | Freq: Every day | ORAL | 1 refills | Status: DC

## 2018-06-21 ENCOUNTER — Encounter: Payer: Self-pay | Admitting: Sports Medicine

## 2018-06-21 ENCOUNTER — Other Ambulatory Visit: Payer: Self-pay

## 2018-06-21 ENCOUNTER — Ambulatory Visit (INDEPENDENT_AMBULATORY_CARE_PROVIDER_SITE_OTHER): Payer: 59 | Admitting: Sports Medicine

## 2018-06-21 DIAGNOSIS — F17200 Nicotine dependence, unspecified, uncomplicated: Secondary | ICD-10-CM

## 2018-06-21 DIAGNOSIS — M533 Sacrococcygeal disorders, not elsewhere classified: Secondary | ICD-10-CM

## 2018-06-21 NOTE — Progress Notes (Signed)
Subjective:    CC: Follow-up  HPI: SI joint pain: Resolved.  I reviewed the past medical history, family history, social history, surgical history, and allergies today and no changes were needed.  Please see the problem list section below in epic for further details.  Past Medical History: Past Medical History:  Diagnosis Date  . Anemia    h/o of Bld. Transfusion post C/Section  . Anxiety    panic attacks - several yrs. ago, pt. reports that she is claustrophobic     . Arthritis    shoulders- ? bursitis   . Cervical dysplasia   . GERD (gastroesophageal reflux disease)   . Headache(784.0)   . Irregular heartbeat    pt. states that she has been told that she has an irreg. heartbeat on occas.  . Ovarian cyst   . Shortness of breath   . Thyroid disease   . Tobacco abuse    Past Surgical History: Past Surgical History:  Procedure Laterality Date  . ABDOMINAL HYSTERECTOMY     bowel repair /w Davinci Robot procedure   . ANTERIOR CERVICAL DECOMP/DISCECTOMY FUSION N/A 01/06/2013   Procedure: ANTERIOR CERVICAL DECOMPRESSION/DISCECTOMY FUSION 2 LEVELS;  Surgeon: Sinclair Ship, MD;  Location: [facility];  Service: Orthopedics;  Laterality: N/A;  Anterior cervical decompression fusion, cervical 5-6, cervical 6-7 with instrumentation and allograft  . CESAREAN SECTION  1994   tubal ligation   . CHOLECYSTECTOMY N/A 09/06/2013   Procedure: LAPAROSCOPIC CHOLECYSTECTOMY WITH INTRAOPERATIVE CHOLANGIOGRAM;  Surgeon: Joyice Faster. Cornett, MD;  Location: [facility];  Service: General;  Laterality: N/A;  . DENTAL SURGERY  02-2013   implants placed  . OOPHORECTOMY    . TONSILLECTOMY     Social History: Social History   Socioeconomic History  . Marital status: Single    Spouse name: Not on file  . Number of children: Not on file  . Years of education: Not on file  . Highest education level: Not on file  Occupational History  . Not on file  Social Needs  . Financial resource strain: Not on  file  . Food insecurity:    Worry: Not on file    Inability: Not on file  . Transportation needs:    Medical: Not on file    Non-medical: Not on file  Tobacco Use  . Smoking status: Current Every Day Smoker    Packs/day: 1.00    Years: 30.00    Pack years: 30.00    Types: Cigarettes  . Smokeless tobacco: Never Used  Substance and Sexual Activity  . Alcohol use: No  . Drug use: No  . Sexual activity: Not Currently  Lifestyle  . Physical activity:    Days per week: Not on file    Minutes per session: Not on file  . Stress: Not on file  Relationships  . Social connections:    Talks on phone: Not on file    Gets together: Not on file    Attends religious service: Not on file    Active member of club or organization: Not on file    Attends meetings of clubs or organizations: Not on file    Relationship status: Not on file  Other Topics Concern  . Not on file  Social History Narrative  . Not on file   Family History: Family History  Problem Relation Age of Onset  . Depression Mother   . Heart attack Father   . Hypertension Father   . Lung cancer Father   .  Cervical cancer Sister   . Alcohol abuse Brother   . Diabetes Maternal Grandmother   . Thyroid cancer Sister    Allergies: Allergies  Allergen Reactions  . Morphine And Related Anaphylaxis   Medications: See med rec.  Review of Systems: No fevers, chills, night sweats, weight loss, chest pain, or shortness of breath.   Objective:    General: Well Developed, well nourished, and in no acute distress.  Neuro: Alert and oriented x3, extra-ocular muscles intact, sensation grossly intact.  HEENT: Normocephalic, atraumatic, pupils equal round reactive to light, neck supple, no masses, no lymphadenopathy, thyroid nonpalpable.  Skin: Warm and dry, no rashes. Cardiac: Regular rate and rhythm, no murmurs rubs or gallops, no lower extremity edema.  Respiratory: Clear to auscultation bilaterally. Not using accessory  muscles, speaking in full sentences.  Impression and Recommendations:    Pain of right sacroiliac joint Resolved with prednisone, she was never able to do physical therapy. Rehab exercises printed out, return to see me as needed. If persistent discomfort we will do a right sacroiliac joint injection.  Tobacco use disorder I have advised to contact PCP to discuss smoking cessation. She would like to discuss Chantix and Wellbutrin. She would like to do both together, I think this is fine. Further management per PCP.  I spent 25 minutes with this patient, greater than 50% was face-to-face time counseling regarding the above diagnoses, specifically she wanted to discuss her cough, I discussed smoking cessation with me.  We discussed the treatment options, prognosis, and I encouraged her to contact her PCP for this.  ___________________________________________ Gwen Her. [name], M.D., ABFM., CAQSM. Primary Care and Sports Medicine [facility]  Adjunct Professor of [employer] [facility] [facility] [employer]

## 2018-06-21 NOTE — Assessment & Plan Note (Addendum)
I have advised to contact PCP to discuss smoking cessation. She would like to discuss Chantix and Wellbutrin. She would like to do both together, I think this is fine. Further management per PCP.

## 2018-06-21 NOTE — Assessment & Plan Note (Signed)
Resolved with prednisone, she was never able to do physical therapy. Rehab exercises printed out, return to see me as needed. If persistent discomfort we will do a right sacroiliac joint injection.

## 2018-07-02 ENCOUNTER — Telehealth: Payer: Self-pay | Admitting: Physician Assistant

## 2018-07-02 NOTE — Telephone Encounter (Signed)
Joanne [name] called and stated she was tested for COVID which was negative.  She works for the [employer] and has been fever free for more than 48 hours. She stated she is scheduled to go back to work tomorrow but [employer] called and said he needs a letter from you releasing her.   He would like the letter to be emailed to [email or web address]  Thank you  [name]

## 2018-07-04 ENCOUNTER — Encounter: Payer: Self-pay | Admitting: Physician Assistant

## 2018-07-04 NOTE — Telephone Encounter (Signed)
Due to HIPAA I am unable to email work notes. Letter is available on patient's MyChart

## 2018-08-10 ENCOUNTER — Telehealth: Payer: Self-pay

## 2018-08-10 NOTE — Telephone Encounter (Signed)
Pt left vm saying that during her last visit you all discussed smoking cessations.  She declined at that time but she is now wanting go forward with the offer. Please advise. -EH/RMA

## 2018-08-10 NOTE — Telephone Encounter (Signed)
I left pt a VM to get her scheduled for an appt for smoking help

## 2018-08-10 NOTE — Telephone Encounter (Signed)
Schedule e-visit for smoking cessation

## 2018-08-24 ENCOUNTER — Other Ambulatory Visit: Payer: Self-pay

## 2018-08-24 ENCOUNTER — Encounter: Payer: Self-pay | Admitting: Family Medicine

## 2018-08-24 ENCOUNTER — Ambulatory Visit (INDEPENDENT_AMBULATORY_CARE_PROVIDER_SITE_OTHER): Payer: 59 | Admitting: Family Medicine

## 2018-08-24 ENCOUNTER — Ambulatory Visit (INDEPENDENT_AMBULATORY_CARE_PROVIDER_SITE_OTHER): Payer: 59

## 2018-08-24 VITALS — BP 123/84 | HR 104 | Ht 65.0 in | Wt 174.0 lb

## 2018-08-24 DIAGNOSIS — M5412 Radiculopathy, cervical region: Secondary | ICD-10-CM

## 2018-08-24 MED ORDER — HYDROCODONE-ACETAMINOPHEN 5-325 MG PO TABS: 1.0000 | ORAL_TABLET | Freq: Four times a day (QID) | ORAL | 0 refills | Status: DC | PRN

## 2018-08-24 MED ORDER — GABAPENTIN 300 MG PO CAPS: ORAL_CAPSULE | ORAL | 3 refills | Status: DC

## 2018-08-24 MED ORDER — PREDNISONE 50 MG PO TABS: 50.0000 mg | ORAL_TABLET | Freq: Every day | ORAL | 0 refills | Status: DC

## 2018-08-24 NOTE — Progress Notes (Signed)
Joanne [name] is a 48 y.o. female who presents to [facility] today for right posterior shoulder pain radiating to her arm.  Symptoms present for about 3 weeks.  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  She denies any injury.  She notes pain is located in the posterior neck and shoulder and radiates down the lateral and ulnar arm to the forearm.  She denies any symptoms radiating to the hand.  She notes pain as well as a burning sensation.  Symptoms are worse with neck motion.  She was seen in the emergency room about 2 days ago.  She had a chest x-ray that was read as normal as well as a relatively normal EKG.  She was thought to have myofascial strain or spasm and was given a Toradol injection and a prescription for Flexeril and ibuprofen.  She notes this has not helped much at all.  She took a leftover hydrocodone which did not help much either.  Is a pertinent past surgical history for fusions at C5-6 and C6-7.   ROS:  As above  Exam:  BP 123/84   Pulse (!) 104   Ht 5\' 5"  (1.651 m)   Wt 174 lb (78.9 kg)   SpO2 100%   BMI 28.96 kg/m  Wt Readings from Last 5 Encounters:  08/24/18 174 lb (78.9 kg)  05/20/18 157 lb (71.2 kg)  04/02/18 156 lb (70.8 kg)  11/09/17 160 lb (72.6 kg)  11/06/17 158 lb (71.7 kg)   General: Well Developed, well nourished, and in no acute distress.  Neuro/Psych: Alert and oriented x3, extra-ocular muscles intact, able to move all 4 extremities, sensation grossly intact. Skin: Warm and dry, no rashes noted.  Respiratory: Not using accessory muscles, speaking in full sentences, trachea midline.  Cardiovascular: Pulses palpable, no extremity edema. Abdomen: Does not appear distended. MSK:  C-spine: Nontender to spinal midline. Not particularly tender along paraspinal musculature or posterior shoulder girdle. Decreased cervical  motion.  Symptoms worsen with neck motion. Upper extremity strength reflexes and sensation are equal and normal throughout bilateral upper extremities. Right shoulder: Normal-appearing normal motion normal strength.  Mildly positive Hawkins and Neer's test.  Mildly positive empty can test.   Left shoulder normal-appearing normal motion normal strength negative impingement testing    Lab and Radiology Results X-ray images C-spine personally independently reviewed Intact fusion at C5-6 and C6-7.  Degenerative changes at C7-T1 with neuroforaminal stenosis.  Loss of cervical lordosis above fusion indicating spasm.  No fractures or severe degenerative changes above C5 C4  Await formal radiology review  Assessment and Plan: 48 y.o. female with posterior shoulder and arm pain is very likely C8 radiculopathy.  Plan for trial of course of prednisone gabapentin and short course of hydrocodone.  If not improving given significant pain neck step would be MRI C-spine for epidural steroid injection planning.   PDMP reviewed during this encounter. Orders Placed This Encounter  Procedures  . DG Cervical Spine Complete    Standing Status:   Future    Number of Occurrences:   1    Standing Expiration Date:   10/24/2019    Order Specific Question:   Reason for Exam (SYMPTOM  OR DIAGNOSIS REQUIRED)    Answer:   eval right arm pain and right lateral neck pain    Order Specific Question:   Is patient pregnant?  Answer:   No    Order Specific Question:   Preferred imaging location?    Answer:   [facility]    Order Specific Question:   Radiology Contrast Protocol - do NOT remove file path    Answer:   [email or web address]   Meds ordered this encounter  Medications  . predniSONE (DELTASONE) 50 MG tablet    Sig: Take 1 tablet (50 mg total) by mouth daily.    Dispense:  5 tablet    Refill:  0  . gabapentin (NEURONTIN) 300 MG capsule    Sig: One tab PO  qHS for a week, then BID for a week, then TID. May double weekly to a max of 3,600mg /day    Dispense:  180 capsule    Refill:  3  . HYDROcodone-acetaminophen (NORCO/VICODIN) 5-325 MG tablet    Sig: Take 1 tablet by mouth every 6 (six) hours as needed.    Dispense:  15 tablet    Refill:  0    Historical information moved to improve visibility of documentation.  Past Medical History:  Diagnosis Date  . Anemia    h/o of Bld. Transfusion post C/Section  . Anxiety    panic attacks - several yrs. ago, pt. reports that she is claustrophobic     . Arthritis    shoulders- ? bursitis   . Cervical dysplasia   . GERD (gastroesophageal reflux disease)   . Headache(784.0)   . Irregular heartbeat    pt. states that she has been told that she has an irreg. heartbeat on occas.  . Ovarian cyst   . Shortness of breath   . Thyroid disease   . Tobacco abuse    Past Surgical History:  Procedure Laterality Date  . ABDOMINAL HYSTERECTOMY     bowel repair /w Davinci Robot procedure   . ANTERIOR CERVICAL DECOMP/DISCECTOMY FUSION N/A 01/06/2013   Procedure: ANTERIOR CERVICAL DECOMPRESSION/DISCECTOMY FUSION 2 LEVELS;  Surgeon: Sinclair Ship, MD;  Location: [facility];  Service: Orthopedics;  Laterality: N/A;  Anterior cervical decompression fusion, cervical 5-6, cervical 6-7 with instrumentation and allograft  . CESAREAN SECTION  1994   tubal ligation   . CHOLECYSTECTOMY N/A 09/06/2013   Procedure: LAPAROSCOPIC CHOLECYSTECTOMY WITH INTRAOPERATIVE CHOLANGIOGRAM;  Surgeon: Joyice Faster. Cornett, MD;  Location: [facility];  Service: General;  Laterality: N/A;  . DENTAL SURGERY  02-2013   implants placed  . OOPHORECTOMY    . TONSILLECTOMY     Social History   Tobacco Use  . Smoking status: Current Every Day Smoker    Packs/day: 1.00    Years: 30.00    Pack years: 30.00    Types: Cigarettes  . Smokeless tobacco: Never Used  Substance Use Topics  . Alcohol use: No   family history includes Alcohol abuse  in her brother; Cervical cancer in her sister; Depression in her mother; Diabetes in her maternal grandmother; Heart attack in her father; Hypertension in her father; Lung cancer in her father; Thyroid cancer in her sister.  Medications: Current Outpatient Medications  Medication Sig Dispense Refill  . acetaminophen (TYLENOL) 650 MG CR tablet Take 2 tablets (1,300 mg total) by mouth every 8 (eight) hours as needed for pain. 90 tablet 3  . levothyroxine (SYNTHROID, LEVOTHROID) 25 MCG tablet Take 1 tablet (25 mcg total) by mouth daily before breakfast. 90 tablet 0  . pantoprazole (PROTONIX) 40 MG tablet Take 1 tablet (40 mg total) by mouth daily. Take 1 tablet twice daily before breakfast and dinner.  90 tablet 1  . gabapentin (NEURONTIN) 300 MG capsule One tab PO qHS for a week, then BID for a week, then TID. May double weekly to a max of 3,600mg /day 180 capsule 3  . HYDROcodone-acetaminophen (NORCO/VICODIN) 5-325 MG tablet Take 1 tablet by mouth every 6 (six) hours as needed. 15 tablet 0  . predniSONE (DELTASONE) 50 MG tablet Take 1 tablet (50 mg total) by mouth daily. 5 tablet 0   No current facility-administered medications for this visit.    Allergies  Allergen Reactions  . Morphine And Related Anaphylaxis      Discussed warning signs or symptoms. Please see discharge instructions. Patient expresses understanding.

## 2018-08-24 NOTE — Patient Instructions (Signed)
Thank you for coming in today. Take the prednisone for 5 days.  Use gabapentin for pain as needed.  Use hydrocodone sparingly.  If not improving let me know and I will proceed to MRI and epidural injection planning.    Cervical Radiculopathy  Cervical radiculopathy happens when a nerve in the neck (cervical nerve) is pinched or bruised. This condition can develop because of an injury or as part of the normal aging process. Pressure on the cervical nerves can cause pain or numbness that runs from the neck all the way down into the arm and fingers. Usually, this condition gets better with rest. Treatment may be needed if the condition does not improve. What are the causes? This condition may be caused by:  Injury.  Slipped (herniated) disk.  Muscle tightness in the neck because of overuse.  Arthritis.  Breakdown or degeneration in the bones and joints of the spine (spondylosis) due to aging.  Bone spurs that may develop near the cervical nerves. What are the signs or symptoms? Symptoms of this condition include:  Pain that runs from the neck to the arm and hand. The pain can be severe or irritating. It may be worse when the neck is moved.  Numbness or weakness in the affected arm and hand. How is this diagnosed? This condition may be diagnosed based on symptoms, medical history, and a physical exam. You may also have tests, including:  X-rays.  CT scan.  MRI.  Electromyogram (EMG).  Nerve conduction tests. How is this treated? In many cases, treatment is not needed for this condition. With rest, the condition usually gets better over time. If treatment is needed, options may include:  Wearing a soft neck collar for short periods of time.  Physical therapy to strengthen your neck muscles.  Medicines, such as NSAIDs, oral corticosteroids, or spinal injections.  Surgery. This may be needed if other treatments do not help. Various types of surgery may be done depending on  the cause of your problems. Follow these instructions at home: Managing pain  Take over-the-counter and prescription medicines only as told by your health care provider.  If directed, apply ice to the affected area. ? Put ice in a plastic bag. ? Place a towel between your skin and the bag. ? Leave the ice on for 20 minutes, 2-3 times per day.  If ice does not help, you can try using heat. Take a warm shower or warm bath, or use a heat pack as told by your health care provider.  Try a gentle neck and shoulder massage to help relieve symptoms. Activity  Rest as needed. Follow instructions from your health care provider about any restrictions on activities.  Do stretching and strengthening exercises as told by your health care provider or physical therapist. General instructions  If you were given a soft collar, wear it as told by your health care provider.  Use a flat pillow when you sleep.  Keep all follow-up visits as told by your health care provider. This is important. Contact a health care provider if:  Your condition does not improve with treatment. Get help right away if:  Your pain gets much worse and cannot be controlled with medicines.  You have weakness or numbness in your hand, arm, face, or leg.  You have a high fever.  You have a stiff, rigid neck.  You lose control of your bowels or your bladder (have incontinence).  You have trouble with walking, balance, or speaking. This information  is not intended to replace advice given to you by your health care provider. Make sure you discuss any questions you have with your health care provider. Document Released: 12/17/2000 Document Revised: 08/30/2015 Document Reviewed: 05/18/2014 Elsevier Interactive Patient Education  [employer].

## 2018-08-29 ENCOUNTER — Other Ambulatory Visit: Payer: Self-pay | Admitting: Physician Assistant

## 2018-08-29 DIAGNOSIS — E039 Hypothyroidism, unspecified: Secondary | ICD-10-CM

## 2018-08-29 DIAGNOSIS — E038 Other specified hypothyroidism: Secondary | ICD-10-CM

## 2018-10-15 ENCOUNTER — Other Ambulatory Visit: Payer: Self-pay | Admitting: Physician Assistant

## 2018-10-15 DIAGNOSIS — K2270 Barrett's esophagus without dysplasia: Secondary | ICD-10-CM

## 2018-10-22 ENCOUNTER — Other Ambulatory Visit: Payer: Self-pay

## 2018-10-22 DIAGNOSIS — K2270 Barrett's esophagus without dysplasia: Secondary | ICD-10-CM

## 2018-10-22 MED ORDER — PANTOPRAZOLE SODIUM 40 MG PO TBEC
40.0000 mg | DELAYED_RELEASE_TABLET | Freq: Two times a day (BID) | ORAL | 1 refills | Status: DC
Start: 2018-10-22 — End: 2019-04-22

## 2018-10-24 ENCOUNTER — Other Ambulatory Visit: Payer: Self-pay | Admitting: Physician Assistant

## 2018-10-24 DIAGNOSIS — E038 Other specified hypothyroidism: Secondary | ICD-10-CM

## 2018-10-24 DIAGNOSIS — E039 Hypothyroidism, unspecified: Secondary | ICD-10-CM

## 2018-11-19 ENCOUNTER — Other Ambulatory Visit: Payer: Self-pay | Admitting: Physician Assistant

## 2018-11-19 DIAGNOSIS — E039 Hypothyroidism, unspecified: Secondary | ICD-10-CM

## 2018-11-19 DIAGNOSIS — E038 Other specified hypothyroidism: Secondary | ICD-10-CM

## 2019-04-21 ENCOUNTER — Other Ambulatory Visit: Payer: Self-pay | Admitting: Physician Assistant

## 2019-04-21 DIAGNOSIS — E039 Hypothyroidism, unspecified: Secondary | ICD-10-CM

## 2019-04-21 DIAGNOSIS — K2270 Barrett's esophagus without dysplasia: Secondary | ICD-10-CM

## 2019-04-21 DIAGNOSIS — E038 Other specified hypothyroidism: Secondary | ICD-10-CM

## 2019-05-04 ENCOUNTER — Other Ambulatory Visit: Payer: Self-pay | Admitting: Physician Assistant

## 2019-05-04 DIAGNOSIS — E038 Other specified hypothyroidism: Secondary | ICD-10-CM

## 2019-05-04 DIAGNOSIS — E039 Hypothyroidism, unspecified: Secondary | ICD-10-CM

## 2019-05-04 MED ORDER — LEVOTHYROXINE SODIUM 25 MCG PO TABS: 25.0000 ug | ORAL_TABLET | Freq: Every day | ORAL | 0 refills | Status: DC

## 2019-05-04 NOTE — Telephone Encounter (Signed)
Patient called and set up a physical appt and to switch care from [facility] to [facility] on Monday 04/08/2019 and states she is out of medication listed below and didn't know if it could get refilled until appt time. Please Advise.   levothyroxine (SYNTHROID) 25 MCG tablet

## 2019-05-04 NOTE — Telephone Encounter (Signed)
Medication sent to pharmacy. Patient is scheduled for 05/09/2019.

## 2019-05-09 ENCOUNTER — Ambulatory Visit (INDEPENDENT_AMBULATORY_CARE_PROVIDER_SITE_OTHER): Payer: 59 | Admitting: Medical-Surgical

## 2019-05-09 ENCOUNTER — Other Ambulatory Visit: Payer: Self-pay

## 2019-05-09 ENCOUNTER — Encounter: Payer: Self-pay | Admitting: Medical-Surgical

## 2019-05-09 VITALS — BP 129/81 | HR 78 | Temp 97.9°F | Ht 65.0 in | Wt 176.0 lb

## 2019-05-09 DIAGNOSIS — K2270 Barrett's esophagus without dysplasia: Secondary | ICD-10-CM

## 2019-05-09 DIAGNOSIS — Z0000 Encounter for general adult medical examination without abnormal findings: Secondary | ICD-10-CM | POA: Diagnosis not present

## 2019-05-09 DIAGNOSIS — E038 Other specified hypothyroidism: Secondary | ICD-10-CM

## 2019-05-09 DIAGNOSIS — F17200 Nicotine dependence, unspecified, uncomplicated: Secondary | ICD-10-CM | POA: Diagnosis not present

## 2019-05-09 DIAGNOSIS — E039 Hypothyroidism, unspecified: Secondary | ICD-10-CM | POA: Diagnosis not present

## 2019-05-09 DIAGNOSIS — Z1231 Encounter for screening mammogram for malignant neoplasm of breast: Secondary | ICD-10-CM

## 2019-05-09 LAB — POCT URINALYSIS DIP (CLINITEK)
Bilirubin, UA: NEGATIVE
Glucose, UA: NEGATIVE mg/dL
Ketones, POC UA: NEGATIVE mg/dL
Leukocytes, UA: NEGATIVE
Nitrite, UA: NEGATIVE
POC PROTEIN,UA: NEGATIVE
Spec Grav, UA: 1.02 (ref 1.010–1.025)
Urobilinogen, UA: 0.2 E.U./dL
pH, UA: 5.5 (ref 5.0–8.0)

## 2019-05-09 MED ORDER — NICOTINE 21 MG/24HR TD PT24: 21.0000 mg | MEDICATED_PATCH | Freq: Every day | TRANSDERMAL | 0 refills | Status: AC

## 2019-05-09 MED ORDER — LEVOTHYROXINE SODIUM 25 MCG PO TABS: 25.0000 ug | ORAL_TABLET | Freq: Every day | ORAL | 0 refills | Status: DC

## 2019-05-09 NOTE — Patient Instructions (Signed)
Preventive Care 49 Years Old, Female Preventive care refers to visits with your health care provider and lifestyle choices that can promote health and wellness. This includes:  A yearly physical exam. This may also be called an annual well check.  Regular dental visits and eye exams.  Immunizations.  Screening for certain conditions.  Healthy lifestyle choices, such as eating a healthy diet, getting regular exercise, not using drugs or products that contain nicotine and tobacco, and limiting alcohol use. What can I expect for my preventive care visit? Physical exam Your health care provider will check your:  Height and weight. This may be used to calculate body mass index (BMI), which tells if you are at a healthy weight.  Heart rate and blood pressure.  Skin for abnormal spots. Counseling Your health care provider may ask you questions about your:  Alcohol, tobacco, and drug use.  Emotional well-being.  Home and relationship well-being.  Sexual activity.  Eating habits.  Work and work environment.  Method of birth control.  Menstrual cycle.  Pregnancy history. What immunizations do I need?  Influenza (flu) vaccine  This is recommended every year. Tetanus, diphtheria, and pertussis (Tdap) vaccine  You may need a Td booster every 10 years. Varicella (chickenpox) vaccine  You may need this if you have not been vaccinated. Zoster (shingles) vaccine  You may need this after age 60. Measles, mumps, and rubella (MMR) vaccine  You may need at least one dose of MMR if you were born in 1957 or later. You may also need a second dose. Pneumococcal conjugate (PCV13) vaccine  You may need this if you have certain conditions and were not previously vaccinated. Pneumococcal polysaccharide (PPSV23) vaccine  You may need one or two doses if you smoke cigarettes or if you have certain conditions. Meningococcal conjugate (MenACWY) vaccine  You may need this if you  have certain conditions. Hepatitis A vaccine  You may need this if you have certain conditions or if you travel or work in places where you may be exposed to hepatitis A. Hepatitis B vaccine  You may need this if you have certain conditions or if you travel or work in places where you may be exposed to hepatitis B. Haemophilus influenzae type b (Hib) vaccine  You may need this if you have certain conditions. Human papillomavirus (HPV) vaccine  If recommended by your health care provider, you may need three doses over 6 months. You may receive vaccines as individual doses or as more than one vaccine together in one shot (combination vaccines). Talk with your health care provider about the risks and benefits of combination vaccines. What tests do I need? Blood tests  Lipid and cholesterol levels. These may be checked every 5 years, or more frequently if you are over 50 years old.  Hepatitis C test.  Hepatitis B test. Screening  Lung cancer screening. You may have this screening every year starting at age 55 if you have a 30-pack-year history of smoking and currently smoke or have quit within the past 15 years.  Colorectal cancer screening. All adults should have this screening starting at age 50 and continuing until age 75. Your health care provider may recommend screening at age 45 if you are at increased risk. You will have tests every 1-10 years, depending on your results and the type of screening test.  Diabetes screening. This is done by checking your blood sugar (glucose) after you have not eaten for a while (fasting). You may have this   done every 1-3 years.  Mammogram. This may be done every 1-2 years. Talk with your health care provider about when you should start having regular mammograms. This may depend on whether you have a family history of breast cancer.  BRCA-related cancer screening. This may be done if you have a family history of breast, ovarian, tubal, or peritoneal  cancers.  Pelvic exam and Pap test. This may be done every 3 years starting at age 50. Starting at age 50, this may be done every 5 years if you have a Pap test in combination with an HPV test. Other tests  Sexually transmitted disease (STD) testing.  Bone density scan. This is done to screen for osteoporosis. You may have this scan if you are at high risk for osteoporosis. Follow these instructions at home: Eating and drinking  Eat a diet that includes fresh fruits and vegetables, whole grains, lean protein, and low-fat dairy.  Take vitamin and mineral supplements as recommended by your health care provider.  Do not drink alcohol if: ? Your health care provider tells you not to drink. ? You are pregnant, may be pregnant, or are planning to become pregnant.  If you drink alcohol: ? Limit how much you have to 0-1 drink a day. ? Be aware of how much alcohol is in your drink. In the [place] one drink equals one 12 oz bottle of beer (355 mL), one 5 oz glass of wine (148 mL), or one 1 oz glass of hard liquor (44 mL). Lifestyle  Take daily care of your teeth and gums.  Stay active. Exercise for at least 30 minutes on 5 or more days each week.  Do not use any products that contain nicotine or tobacco, such as cigarettes, e-cigarettes, and chewing tobacco. If you need help quitting, ask your health care provider.  If you are sexually active, practice safe sex. Use a condom or other form of birth control (contraception) in order to prevent pregnancy and STIs (sexually transmitted infections).  If told by your health care provider, take low-dose aspirin daily starting at age 50. What's next?  Visit your health care provider once a year for a well check visit.  Ask your health care provider how often you should have your eyes and teeth checked.  Stay up to date on all vaccines. This information is not intended to replace advice given to you by your health care provider. Make sure you  discuss any questions you have with your health care provider. Document Revised: 12/03/2017 Document Reviewed: 12/03/2017 Elsevier Patient Education  2020 [employer].

## 2019-05-09 NOTE — Progress Notes (Signed)
HPI: Joanne [name] is a 49 y.o. female who  has a past medical history of Anemia, Anxiety, Arthritis, Cervical dysplasia, GERD (gastroesophageal reflux disease), Headache(784.0), Irregular heartbeat, Ovarian cyst, Shortness of breath, Thyroid disease, and Tobacco abuse.  she presents to [facility] today, 05/09/19,  for chief complaint of:  Annual physical exam  Pap: hysterectomy with oophorectomy, unsure if cervix removed; procedure by Dr. Odelia Gage in [place], [place]. Will attempt to obtain records.  Eye exam: none in several years  Subclinical hypothyroidism: Only taking Synthroid again for ~ 2 weeks, was previously out for ~ 3 months. Fatigued, weight gain, intermittent palpitations.   Tobacco use disorder: Smoking 1 ppd cigarette, since Dec, previously 2 ppd. Interested in quitting. Has tried hypnosis, Chantix, no relief. Quit for 1 month using the nicotine patch. Would like to try the patch again.   GERD/Barrett's esophagus: taking Pantoprazole. Still having reflux daily. Worse when lying down, foul taste in mouth. Very restricted diet due to GI discomfort with eating. Has not been to GI for follow up since 2019.   Past medical, surgical, social and family history reviewed:  Patient Active Problem List   Diagnosis Date Noted  . Pain of right sacroiliac joint 05/20/2018  . Right knee pain 05/20/2018  . Family history of lung cancer 04/02/2018  . Subclinical hypothyroidism 06/24/2017  . History of cervical dysplasia 06/24/2017  . History of hysterectomy for indication other than cancer 06/24/2017  . Arthralgia 06/24/2017  . Polyphagia 06/24/2017  . Barrett's esophagus determined by endoscopy 06/23/2017  . Esophagitis, erosive 12/19/2014  . Mass of left axilla 04/03/2014  . Excessive daytime sleepiness 03/14/2014  . Abdominal pain, epigastric 03/14/2014  . Annual physical exam 01/17/2014  . Tobacco use disorder 11/17/2013  . Obesity  08/24/2013  . Right lumbar radiculitis 06/07/2013  . Venous stasis dermatitis 12/09/2012  . Insomnia 11/24/2012  . Major depression 11/24/2012  . Menopausal symptoms 11/24/2012  . Dysplasia of cervix 11/24/2012  . Degenerative disc disease, cervical 11/12/2012    Past Surgical History:  Procedure Laterality Date  . ABDOMINAL HYSTERECTOMY     bowel repair /w Davinci Robot procedure   . ANTERIOR CERVICAL DECOMP/DISCECTOMY FUSION N/A 01/06/2013   Procedure: ANTERIOR CERVICAL DECOMPRESSION/DISCECTOMY FUSION 2 LEVELS;  Surgeon: Sinclair Ship, MD;  Location: [facility];  Service: Orthopedics;  Laterality: N/A;  Anterior cervical decompression fusion, cervical 5-6, cervical 6-7 with instrumentation and allograft  . CESAREAN SECTION  1994   tubal ligation   . CHOLECYSTECTOMY N/A 09/06/2013   Procedure: LAPAROSCOPIC CHOLECYSTECTOMY WITH INTRAOPERATIVE CHOLANGIOGRAM;  Surgeon: Joyice Faster. Cornett, MD;  Location: [facility];  Service: General;  Laterality: N/A;  . DENTAL SURGERY  02-2013   implants placed  . OOPHORECTOMY    . TONSILLECTOMY      Social History   Tobacco Use  . Smoking status: Current Every Day Smoker    Packs/day: 1.00    Years: 30.00    Pack years: 30.00    Types: Cigarettes  . Smokeless tobacco: Never Used  Substance Use Topics  . Alcohol use: No    Family History  Problem Relation Age of Onset  . Depression Mother   . Heart attack Father   . Hypertension Father   . Lung cancer Father   . Cervical cancer Sister   . Alcohol abuse Brother   . Diabetes Maternal Grandmother   . Thyroid cancer Sister      Current medication list and allergy/intolerance information reviewed:  Current Outpatient Medications  Medication Sig Dispense Refill  . diphenhydramine-acetaminophen (TYLENOL PM) 25-500 MG TABS tablet Take 1 tablet by mouth at bedtime as needed.    . gabapentin (NEURONTIN) 300 MG capsule One tab PO qHS for a week, then BID for a week, then TID. May double weekly  to a max of 3,674m/day 180 capsule 3  . levothyroxine (SYNTHROID) 25 MCG tablet Take 1 tablet (25 mcg total) by mouth daily before breakfast. Due for labs/FINAL REFILL 30 tablet 0  . pantoprazole (PROTONIX) 40 MG tablet TAKE 1 TAB BY MOUTH 2TIMES DAILY BEFORE A MEAL. PLEASE SCHEDULE A FOLLOW UP APPT FOR FURTHER REFILLS 30 tablet 1  . acetaminophen (TYLENOL) 650 MG CR tablet Take 2 tablets (1,300 mg total) by mouth every 8 (eight) hours as needed for pain. (Patient not taking: Reported on 05/09/2019) 90 tablet 3  . HYDROcodone-acetaminophen (NORCO/VICODIN) 5-325 MG tablet Take 1 tablet by mouth every 6 (six) hours as needed. (Patient not taking: Reported on 05/09/2019) 15 tablet 0  . nicotine (NICODERM CQ - DOSED IN MG/24 HOURS) 21 mg/24hr patch Place 1 patch (21 mg total) onto the skin daily. 42 patch 0  . predniSONE (DELTASONE) 50 MG tablet Take 1 tablet (50 mg total) by mouth daily. (Patient not taking: Reported on 05/09/2019) 5 tablet 0   No current facility-administered medications for this visit.    Allergies  Allergen Reactions  . Morphine And Related Anaphylaxis      Review of Systems:  Constitutional:  No  fever, no chills, No recent illness, Unintentional weight gain ~20lbs in the last year.   HEENT: No vision change, no hearing change, No sore throat, No  sinus pressure. Chronic headaches once weekly treated with Tylenol, mostly helpful.  Cardiac: No  chest pain, No  pressure, No palpitations, No  Orthopnea  Respiratory:  Intermittent SOB. Cough occasionally productive, clear.  Gastrointestinal: No  nausea, No  vomiting,  No  blood in stool, No  Constipation. Chronic abdominal pain, chronic liquid stool.   Musculoskeletal: No new myalgia/arthralgia.  Skin: No  Rash, No other wounds/concerning lesions  Genitourinary: No  incontinence, No  abnormal genital bleeding, No abnormal genital discharge  Hem/Onc: No  Easy bleeding, No  abnormal lymph node. Easy bleeding.  Endocrine:  No cold intolerance,  No heat intolerance. No polyuria/polydipsia/polyphagia   Neurologic: No  weakness, No  dizziness, No  slurred speech/focal weakness/facial droop  Psychiatric: No  concerns with depression, No  concerns with anxiety, No sleep problems, No mood problems  Exam:  BP 129/81   Pulse 78   Temp 97.9 F (36.6 C) (Oral)   Ht _0  (1.651 m)   Wt 176 lb (79.8 kg)   SpO2 100%   BMI 29.29 kg/m   Constitutional: VS see above. General Appearance: alert, well-developed, well-nourished, NAD  Eyes: Normal lids and conjunctive, non-icteric sclera  Ears, Nose, Mouth, Throat: MMM, Normal external inspection ears/nares/mouth/lips/gums. TM normal bilaterally. Pharynx/tonsils no erythema, no exudate. Nasal mucosa normal.   Neck: No masses, trachea midline. No thyroid enlargement. No tenderness/mass appreciated. No lymphadenopathy  Respiratory: Normal respiratory effort. no wheeze, no rhonchi, no rales  Cardiovascular: S1/S2 normal, no murmur, no rub/gallop auscultated. RRR. No lower extremity edema. Pedal pulse II/IV bilaterally DP and PT. No carotid bruit or JVD. No abdominal aortic bruit.  Gastrointestinal: Nontender, no masses. No hepatomegaly, no splenomegaly. No hernia appreciated. Bowel sounds normal. Rectal exam deferred.   Musculoskeletal: Gait normal. No clubbing/cyanosis of digits.   Neurological:  Normal balance/coordination. No tremor. No cranial nerve deficit on limited exam. Motor and sensation intact and symmetric. Cerebellar reflexes intact.   Skin: warm, dry, intact. No rash/ulcer. No concerning nevi or subq nodules on limited exam.    Psychiatric: Normal judgment/insight. Normal mood and affect. Oriented x3.    Results for orders placed or performed in visit on 05/09/19 (from the past 72 hour(s))  POCT URINALYSIS DIP (CLINITEK)     Status: Abnormal   Collection Time: 05/09/19  3:17 PM  Result Value Ref Range   Color, UA yellow yellow   Clarity, UA clear  clear   Glucose, UA negative negative mg/dL   Bilirubin, UA negative negative   Ketones, POC UA negative negative mg/dL   Spec Grav, UA 1.020 1.010 - 1.025   Blood, UA trace-lysed (A) negative   pH, UA 5.5 5.0 - 8.0   POC PROTEIN,UA negative negative, trace   Urobilinogen, UA 0.2 0.2 or 1.0 E.U./dL   Nitrite, UA Negative Negative   Leukocytes, UA Negative Negative    No results found.   ASSESSMENT/PLAN: The primary encounter diagnosis was Annual physical exam. Diagnoses of Tobacco use disorder, Subclinical hypothyroidism, Barrett's esophagus determined by endoscopy, and Breast cancer screening by mammogram were also pertinent to this visit.   Annual physical exam  Checking CBC, CMP, Lipids, and TSH  Mammogram ordered   Subclinical hypothyroidism  Restart Synthroid 25 mcg daily.  Checking TSH   GERD/Barrett's esophagus  Refer to GI   Tobacco use disorder  Discussed smoking cessation, risks and benefits  Nicotine patch 70m daily x 6 weeks   Orders Placed This Encounter  Procedures  . MM 3D SCREEN BREAST BILATERAL  . CBC  . COMPLETE METABOLIC PANEL WITH GFR  . Lipid panel  . TSH  . Ambulatory referral to Gastroenterology  . POCT URINALYSIS DIP (CLINITEK)    Meds ordered this encounter  Medications  . nicotine (NICODERM CQ - DOSED IN MG/24 HOURS) 21 mg/24hr patch    Sig: Place 1 patch (21 mg total) onto the skin daily.    Dispense:  42 patch    Refill:  0    Order Specific Question:   Supervising Provider    Answer:   AEmeterio Reeve[[id number] . levothyroxine (SYNTHROID) 25 MCG tablet    Sig: Take 1 tablet (25 mcg total) by mouth daily before breakfast. Due for labs/FINAL REFILL    Dispense:  30 tablet    Refill:  0    Order Specific Question:   Supervising Provider    Answer:   AEmeterio Reeve[[[id number]   Patient Instructions  Preventive Care [id number] Old, Female Preventive care refers to visits with your health care provider and  lifestyle choices that can promote health and wellness. This includes:  A yearly physical exam. This may also be called an annual well check.  Regular dental visits and eye exams.  Immunizations.  Screening for certain conditions.  Healthy lifestyle choices, such as eating a healthy diet, getting regular exercise, not using drugs or products that contain nicotine and tobacco, and limiting alcohol use. What can I expect for my preventive care visit? Physical exam Your health care provider will check your:  Height and weight. This may be used to calculate body mass index (BMI), which tells if you are at a healthy weight.  Heart rate and blood pressure.  Skin for abnormal spots. Counseling Your health care provider may ask you questions about your:  Alcohol, tobacco, and  drug use.  Emotional well-being.  Home and relationship well-being.  Sexual activity.  Eating habits.  Work and work Statistician.  Method of birth control.  Menstrual cycle.  Pregnancy history. What immunizations do I need?  Influenza (flu) vaccine  This is recommended every year. Tetanus, diphtheria, and pertussis (Tdap) vaccine  You may need a Td booster every 10 years. Varicella (chickenpox) vaccine  You may need this if you have not been vaccinated. Zoster (shingles) vaccine  You may need this after age 40. Measles, mumps, and rubella (MMR) vaccine  You may need at least one dose of MMR if you were born in 1957 or later. You may also need a second dose. Pneumococcal conjugate (PCV13) vaccine  You may need this if you have certain conditions and were not previously vaccinated. Pneumococcal polysaccharide (PPSV23) vaccine  You may need one or two doses if you smoke cigarettes or if you have certain conditions. Meningococcal conjugate (MenACWY) vaccine  You may need this if you have certain conditions. Hepatitis A vaccine  You may need this if you have certain conditions or if you  travel or work in places where you may be exposed to hepatitis A. Hepatitis B vaccine  You may need this if you have certain conditions or if you travel or work in places where you may be exposed to hepatitis B. Haemophilus influenzae type b (Hib) vaccine  You may need this if you have certain conditions. Human papillomavirus (HPV) vaccine  If recommended by your health care provider, you may need three doses over 6 months. You may receive vaccines as individual doses or as more than one vaccine together in one shot (combination vaccines). Talk with your health care provider about the risks and benefits of combination vaccines. What tests do I need? Blood tests  Lipid and cholesterol levels. These may be checked every 5 years, or more frequently if you are over 81 years old.  Hepatitis C test.  Hepatitis B test. Screening  Lung cancer screening. You may have this screening every year starting at age 34 if you have a 30-pack-year history of smoking and currently smoke or have quit within the past 15 years.  Colorectal cancer screening. All adults should have this screening starting at age 44 and continuing until age 26. Your health care provider may recommend screening at age 24 if you are at increased risk. You will have tests every 1-10 years, depending on your results and the type of screening test.  Diabetes screening. This is done by checking your blood sugar (glucose) after you have not eaten for a while (fasting). You may have this done every 1-3 years.  Mammogram. This may be done every 1-2 years. Talk with your health care provider about when you should start having regular mammograms. This may depend on whether you have a family history of breast cancer.  BRCA-related cancer screening. This may be done if you have a family history of breast, ovarian, tubal, or peritoneal cancers.  Pelvic exam and Pap test. This may be done every 3 years starting at age 55. Starting at age 70,  this may be done every 5 years if you have a Pap test in combination with an HPV test. Other tests  Sexually transmitted disease (STD) testing.  Bone density scan. This is done to screen for osteoporosis. You may have this scan if you are at high risk for osteoporosis. Follow these instructions at home: Eating and drinking  Eat a diet that includes fresh  fruits and vegetables, whole grains, lean protein, and low-fat dairy.  Take vitamin and mineral supplements as recommended by your health care provider.  Do not drink alcohol if: ? Your health care provider tells you not to drink. ? You are pregnant, may be pregnant, or are planning to become pregnant.  If you drink alcohol: ? Limit how much you have to 0-1 drink a day. ? Be aware of how much alcohol is in your drink. In the [place] one drink equals one 12 oz bottle of beer (355 mL), one 5 oz glass of wine (148 mL), or one 1 oz glass of hard liquor (44 mL). Lifestyle  Take daily care of your teeth and gums.  Stay active. Exercise for at least 30 minutes on 5 or more days each week.  Do not use any products that contain nicotine or tobacco, such as cigarettes, e-cigarettes, and chewing tobacco. If you need help quitting, ask your health care provider.  If you are sexually active, practice safe sex. Use a condom or other form of birth control (contraception) in order to prevent pregnancy and STIs (sexually transmitted infections).  If told by your health care provider, take low-dose aspirin daily starting at age 38. What's next?  Visit your health care provider once a year for a well check visit.  Ask your health care provider how often you should have your eyes and teeth checked.  Stay up to date on all vaccines. This information is not intended to replace advice given to you by your health care provider. Make sure you discuss any questions you have with your health care provider. Document Revised: 12/03/2017 Document Reviewed:  12/03/2017 Elsevier Patient Education  [employer].    Follow-up plan: Return in about 1 day (around 05/10/2019) for follow up as scheduled.  Clearnce Sorrel, DNP, APRN, FNP-BC [facility]

## 2019-05-10 ENCOUNTER — Ambulatory Visit (INDEPENDENT_AMBULATORY_CARE_PROVIDER_SITE_OTHER): Payer: 59 | Admitting: Medical-Surgical

## 2019-05-10 ENCOUNTER — Encounter: Payer: Self-pay | Admitting: Medical-Surgical

## 2019-05-10 VITALS — BP 168/95 | HR 75 | Temp 97.9°F | Ht 65.0 in | Wt 176.0 lb

## 2019-05-10 DIAGNOSIS — F418 Other specified anxiety disorders: Secondary | ICD-10-CM | POA: Diagnosis not present

## 2019-05-10 DIAGNOSIS — G479 Sleep disorder, unspecified: Secondary | ICD-10-CM

## 2019-05-10 DIAGNOSIS — L918 Other hypertrophic disorders of the skin: Secondary | ICD-10-CM | POA: Insufficient documentation

## 2019-05-10 DIAGNOSIS — R1011 Right upper quadrant pain: Secondary | ICD-10-CM

## 2019-05-10 MED ORDER — SERTRALINE HCL 25 MG PO TABS: 25.0000 mg | ORAL_TABLET | Freq: Every day | ORAL | 2 refills | Status: DC

## 2019-05-10 NOTE — Assessment & Plan Note (Signed)
Previous negative work-up.  Tenderness over right lower ribs suspicious for costochondritis.  Trial Voltaren gel twice daily.  If ineffective, may switch to oral anti-inflammatories.

## 2019-05-10 NOTE — Progress Notes (Signed)
Subjective:    CC: Right upper quadrant pain, increased stress, skin tag  HPI: 49 year old female presenting today with complaints as follows:  Right upper quadrant pain: Reports she has been experiencing this pain since 1994 after her youngest daughter was born.  She had her gallbladder removed in 2014.  Previous work-up including abdominal ultrasound and chest x-ray negative for any abnormal findings.  Pain is reported as constant but does appear to get worse when eating greasy foods, drinking sodas.  Occasionally radiates to her back.  Increased stress: Reports her job is very stressful but her main source has recently been her daughter who lives with her temporarily.  There has been a lot of arguing amongst them.  Poor sleep, waking up a lot but falls asleep okay.  Wakes up at night nearly every hour and gets up to eat.  Recently reports snoring and wakes up gasping at times.  Skin tag: Has a small growth on her left cheek approximately 1 inch from the nasal bridge.  Growth has been there for approximately 1 year and has become very annoying as she can see it in her peripheral vision.  Would like to have this removed today.  I reviewed the past medical history, family history, social history, surgical history, and allergies today and no changes were needed.  Please see the problem list section below in epic for further details.  Past Medical History: Past Medical History:  Diagnosis Date  . Anemia    h/o of Bld. Transfusion post C/Section  . Anxiety    panic attacks - several yrs. ago, pt. reports that she is claustrophobic     . Arthritis    shoulders- ? bursitis   . Cervical dysplasia   . GERD (gastroesophageal reflux disease)   . Headache(784.0)   . Irregular heartbeat    pt. states that she has been told that she has an irreg. heartbeat on occas.  . Ovarian cyst   . Shortness of breath   . Thyroid disease   . Tobacco abuse    Past Surgical History: Past Surgical History:   Procedure Laterality Date  . ABDOMINAL HYSTERECTOMY     bowel repair /w Davinci Robot procedure   . ANTERIOR CERVICAL DECOMP/DISCECTOMY FUSION N/A 01/06/2013   Procedure: ANTERIOR CERVICAL DECOMPRESSION/DISCECTOMY FUSION 2 LEVELS;  Surgeon: Sinclair Ship, MD;  Location: [facility];  Service: Orthopedics;  Laterality: N/A;  Anterior cervical decompression fusion, cervical 5-6, cervical 6-7 with instrumentation and allograft  . CESAREAN SECTION  1994   tubal ligation   . CHOLECYSTECTOMY N/A 09/06/2013   Procedure: LAPAROSCOPIC CHOLECYSTECTOMY WITH INTRAOPERATIVE CHOLANGIOGRAM;  Surgeon: Joyice Faster. Cornett, MD;  Location: [facility];  Service: General;  Laterality: N/A;  . DENTAL SURGERY  02-2013   implants placed  . OOPHORECTOMY    . TONSILLECTOMY     Social History: Social History   Socioeconomic History  . Marital status: Single    Spouse name: Not on file  . Number of children: Not on file  . Years of education: Not on file  . Highest education level: Not on file  Occupational History  . Occupation: Public librarian  Tobacco Use  . Smoking status: Current Every Day Smoker    Packs/day: 1.00    Years: 30.00    Pack years: 30.00    Types: Cigarettes  . Smokeless tobacco: Never Used  Substance and Sexual Activity  . Alcohol use: No  . Drug use: No  . Sexual activity: Not Currently  Other Topics Concern  . Not on file  Social History Narrative  . Not on file   Social Determinants of Health   Financial Resource Strain:   . Difficulty of Paying Living Expenses: Not on file  Food Insecurity:   . Worried About Charity fundraiser in the Last Year: Not on file  . Ran Out of Food in the Last Year: Not on file  Transportation Needs:   . Lack of Transportation (Medical): Not on file  . Lack of Transportation (Non-Medical): Not on file  Physical Activity:   . Days of Exercise per Week: Not on file  . Minutes of Exercise per Session: Not on file  Stress:   . Feeling of Stress : Not  on file  Social Connections:   . Frequency of Communication with Friends and Family: Not on file  . Frequency of Social Gatherings with Friends and Family: Not on file  . Attends Religious Services: Not on file  . Active Member of Clubs or Organizations: Not on file  . Attends Archivist Meetings: Not on file  . Marital Status: Not on file   Family History: Family History  Problem Relation Age of Onset  . Depression Mother   . Heart attack Father   . Hypertension Father   . Lung cancer Father   . Cervical cancer Sister   . Alcohol abuse Brother   . Diabetes Maternal Grandmother   . Thyroid cancer Sister    Allergies: Allergies  Allergen Reactions  . Morphine And Related Anaphylaxis   Medications: See med rec.  Review of Systems: No fevers, chills, night sweats, weight loss, chest pain, or shortness of breath.   Objective:    General: Well Developed, well nourished, and in no acute distress.  Neuro: Alert and oriented x3.  HEENT: Normocephalic, atraumatic.  Skin: Warm and dry, no rashes. Cardiac: Regular rate and rhythm, no murmurs rubs or gallops, no lower extremity edema.  Respiratory: Clear to auscultation bilaterally. Not using accessory muscles, speaking in full sentences. Abdomen: Soft, nontender, nondistended.  Bowel sounds positive x4.  No HSM. MSK: Tenderness over anterior lateral thorax over lower ribs.  Impression and Recommendations:    Skin tag Procedure: Removal of skin tag.  Risks, benefits, and alternatives explained and consent obtained. Time out conducted. Surface prepped in a sterile fashion. Due to small nature of skin tag, patient declined local anesthetic. Skin tag excised from skin surface. Dressing applied. Pt stable. Pt advised to call or RTC for continued bleeding, spreading erythema/induration, fevers, or chills.  Depression with anxiety Starting Zoloft 25 mg daily.  Sleep disturbance Home sleep study.  Reviewed sleep  hygiene recommendations.  RUQ pain Previous negative work-up.  Tenderness over right lower ribs suspicious for costochondritis.  Trial Voltaren gel twice daily.  If ineffective, may switch to oral anti-inflammatories.  Return in about 4 weeks (around 06/07/2019). ___________________________________________ Clearnce Sorrel, DNP, APRN, FNP-BC [facility] [place]

## 2019-05-10 NOTE — Assessment & Plan Note (Signed)
Home sleep study.  Reviewed sleep hygiene recommendations.

## 2019-05-10 NOTE — Assessment & Plan Note (Signed)
Procedure: Removal of skin tag.  Risks, benefits, and alternatives explained and consent obtained. Time out conducted. Surface prepped in a sterile fashion. Due to small nature of skin tag, patient declined local anesthetic. Skin tag excised from skin surface. Dressing applied. Pt stable. Pt advised to call or RTC for continued bleeding, spreading erythema/induration, fevers, or chills.

## 2019-05-10 NOTE — Assessment & Plan Note (Signed)
Starting Zoloft 25 mg daily.

## 2019-05-11 LAB — CBC
HCT: 43.2 % (ref 35.0–45.0)
Hemoglobin: 14.9 g/dL (ref 11.7–15.5)
MCH: 31.9 pg (ref 27.0–33.0)
MCHC: 34.5 g/dL (ref 32.0–36.0)
MCV: 92.5 fL (ref 80.0–100.0)
MPV: 11 fL (ref 7.5–12.5)
Platelets: 201 10*3/uL (ref 140–400)
RBC: 4.67 10*6/uL (ref 3.80–5.10)
RDW: 12.1 % (ref 11.0–15.0)
WBC: 5.6 10*3/uL (ref 3.8–10.8)

## 2019-05-11 LAB — COMPLETE METABOLIC PANEL WITH GFR
AG Ratio: 1.5 (calc) (ref 1.0–2.5)
ALT: 13 U/L (ref 6–29)
AST: 15 U/L (ref 10–35)
Albumin: 4.6 g/dL (ref 3.6–5.1)
Alkaline phosphatase (APISO): 73 U/L (ref 31–125)
BUN: 10 mg/dL (ref 7–25)
CO2: 26 mmol/L (ref 20–32)
Calcium: 9.5 mg/dL (ref 8.6–10.2)
Chloride: 103 mmol/L (ref 98–110)
Creat: 0.63 mg/dL (ref 0.50–1.10)
GFR, Est African American: 122 mL/min/{1.73_m2} (ref >=60)
GFR, Est Non African American: 105 mL/min/{1.73_m2} (ref >=60)
Globulin: 3 g/dL (calc) (ref 1.9–3.7)
Glucose, Bld: 93 mg/dL (ref 65–99)
Potassium: 4.7 mmol/L (ref 3.5–5.3)
Sodium: 137 mmol/L (ref 135–146)
Total Bilirubin: 0.5 mg/dL (ref 0.2–1.2)
Total Protein: 7.6 g/dL (ref 6.1–8.1)

## 2019-05-11 LAB — LIPID PANEL
Cholesterol: 178 mg/dL (ref <200)
HDL: 52 mg/dL (ref >=50)
LDL Cholesterol (Calc): 109 mg/dL (calc) — ABNORMAL HIGH
Non-HDL Cholesterol (Calc): 126 mg/dL (calc) (ref <130)
Total CHOL/HDL Ratio: 3.4 (calc) (ref <5.0)
Triglycerides: 77 mg/dL (ref <150)

## 2019-05-11 LAB — TSH: TSH: 10.52 mIU/L — ABNORMAL HIGH

## 2019-05-12 ENCOUNTER — Other Ambulatory Visit: Payer: Self-pay | Admitting: Physician Assistant

## 2019-05-12 DIAGNOSIS — K2270 Barrett's esophagus without dysplasia: Secondary | ICD-10-CM

## 2019-05-18 ENCOUNTER — Ambulatory Visit (INDEPENDENT_AMBULATORY_CARE_PROVIDER_SITE_OTHER): Payer: 59

## 2019-05-18 ENCOUNTER — Other Ambulatory Visit: Payer: Self-pay

## 2019-05-18 DIAGNOSIS — Z1231 Encounter for screening mammogram for malignant neoplasm of breast: Secondary | ICD-10-CM

## 2019-06-01 ENCOUNTER — Other Ambulatory Visit: Payer: Self-pay | Admitting: Medical-Surgical

## 2019-06-01 DIAGNOSIS — F418 Other specified anxiety disorders: Secondary | ICD-10-CM

## 2019-06-05 ENCOUNTER — Other Ambulatory Visit: Payer: Self-pay | Admitting: Medical-Surgical

## 2019-06-05 DIAGNOSIS — E038 Other specified hypothyroidism: Secondary | ICD-10-CM

## 2019-06-05 DIAGNOSIS — E039 Hypothyroidism, unspecified: Secondary | ICD-10-CM

## 2019-06-07 ENCOUNTER — Encounter: Payer: Self-pay | Admitting: Gastroenterology

## 2019-06-07 ENCOUNTER — Encounter: Payer: Self-pay | Admitting: Medical-Surgical

## 2019-06-07 ENCOUNTER — Ambulatory Visit (INDEPENDENT_AMBULATORY_CARE_PROVIDER_SITE_OTHER): Payer: 59 | Admitting: Medical-Surgical

## 2019-06-07 ENCOUNTER — Ambulatory Visit (INDEPENDENT_AMBULATORY_CARE_PROVIDER_SITE_OTHER): Payer: 59 | Admitting: Gastroenterology

## 2019-06-07 ENCOUNTER — Other Ambulatory Visit: Payer: Self-pay

## 2019-06-07 VITALS — BP 111/77 | HR 86 | Temp 97.5°F | Ht 65.0 in | Wt 178.1 lb

## 2019-06-07 VITALS — BP 124/72 | HR 79 | Temp 98.7°F | Ht 65.0 in | Wt 178.0 lb

## 2019-06-07 DIAGNOSIS — K529 Noninfective gastroenteritis and colitis, unspecified: Secondary | ICD-10-CM | POA: Diagnosis not present

## 2019-06-07 DIAGNOSIS — Z1211 Encounter for screening for malignant neoplasm of colon: Secondary | ICD-10-CM

## 2019-06-07 DIAGNOSIS — K219 Gastro-esophageal reflux disease without esophagitis: Secondary | ICD-10-CM

## 2019-06-07 DIAGNOSIS — E039 Hypothyroidism, unspecified: Secondary | ICD-10-CM

## 2019-06-07 DIAGNOSIS — F418 Other specified anxiety disorders: Secondary | ICD-10-CM

## 2019-06-07 DIAGNOSIS — E038 Other specified hypothyroidism: Secondary | ICD-10-CM

## 2019-06-07 DIAGNOSIS — R002 Palpitations: Secondary | ICD-10-CM | POA: Diagnosis not present

## 2019-06-07 DIAGNOSIS — Z01818 Encounter for other preprocedural examination: Secondary | ICD-10-CM

## 2019-06-07 DIAGNOSIS — R635 Abnormal weight gain: Secondary | ICD-10-CM

## 2019-06-07 DIAGNOSIS — R5383 Other fatigue: Secondary | ICD-10-CM

## 2019-06-07 MED ORDER — COLESEVELAM HCL 625 MG PO TABS: 625.0000 mg | ORAL_TABLET | Freq: Two times a day (BID) | ORAL | 1 refills | Status: DC

## 2019-06-07 MED ORDER — LEVOTHYROXINE SODIUM 25 MCG PO TABS: ORAL_TABLET | ORAL | 0 refills | Status: DC

## 2019-06-07 MED ORDER — METOCLOPRAMIDE HCL 5 MG PO TABS: 5.0000 mg | ORAL_TABLET | Freq: Two times a day (BID) | ORAL | 0 refills | Status: DC | PRN

## 2019-06-07 NOTE — Progress Notes (Signed)
[facility] Progress Note  Chief Complaint: GERD  Subjective  History: Seen in clinic 2019, chronic heartburn and regurgitation, longtime smoker.  Reported history of Barrett's esophagus, though review of records ultimately obtained from a practice in [place] did not show Barrett's. Due to refractory symptoms, EGD performed here on 11/06/2017.  No esophagitis while on PPI therapy.  Small tongue of salmon-colored mucosa biopsied, no intestinal metaplasia.  Joanne [name] is here to follow-up on her reflux, but also has concerns about diarrhea.  She still has frequent heartburn, sometimes feeling of burning and regurgitation in the back of throat especially laying down.  Denies dysphagia or odynophagia.  Has cut smoking down from 2 packs a day to 1/2 pack a day and is using a nicotine patch.  She also describes diarrhea occurring since shortly after cholecystectomy in 2014.  She might have up to 20 loose BMs per day, sometimes just mucus or gas.  There is urgency for bowel movements, usually after meals.  This is very difficult for her to manage in her work at the [employer].  Sometimes she takes Imodium, otherwise has not had any other specific treatments for this.  ROS: Cardiovascular:  no chest pain Respiratory: no dyspnea Mood stable Remainder of systems negative except as above The patient's Past Medical, Family and Social History were reviewed and are on file in the EMR.  Objective:  Med list reviewed  Current Outpatient Medications:  .  diphenhydramine-acetaminophen (TYLENOL PM) 25-500 MG TABS tablet, Take 1 tablet by mouth at bedtime as needed., Disp: , Rfl:  .  gabapentin (NEURONTIN) 300 MG capsule, Take 300 mg by mouth daily as needed., Disp: , Rfl:  .  levothyroxine (SYNTHROID) 25 MCG tablet, TAKE 1 TABLET BY MOUTH DAILY BEFORE BREAKFAST, Disp: 30 tablet, Rfl: 0 .  nicotine (NICODERM CQ - DOSED IN MG/24 HOURS) 21 mg/24hr patch, Place 1 patch (21 mg total) onto the skin  daily., Disp: 42 patch, Rfl: 0 .  pantoprazole (PROTONIX) 40 MG tablet, TAKE 1 TAB BY MOUTH 2TIMES DAILY BEFORE A MEAL. PLEASE SCHEDULE A FOLLOW UP APPT FOR FURTHER REFILLS, Disp: 30 tablet, Rfl: 1 .  sertraline (ZOLOFT) 25 MG tablet, TAKE 1 TABLET BY MOUTH EVERY DAY, Disp: 90 tablet, Rfl: 1 .  colesevelam (WELCHOL) 625 MG tablet, Take 1 tablet (625 mg total) by mouth 2 (two) times daily with a meal., Disp: 60 tablet, Rfl: 1 .  metoCLOPramide (REGLAN) 5 MG tablet, Take 1 tablet (5 mg total) by mouth every 12 (twelve) hours as needed for up to 2 doses for nausea. Take 30-45 minutes before evening and AM doses of bowel preparation solution., Disp: 2 tablet, Rfl: 0   Vital signs in last 24 hrs: Vitals:   06/07/19 1541  BP: 124/72  Pulse: 79  Temp: 98.7 F (37.1 C)    Physical Exam  Well-appearing, gravelly vocal quality as before  HEENT: sclera anicteric, oral mucosa moist without lesions  Neck: supple, no thyromegaly, JVD or lymphadenopathy  Cardiac: RRR without murmurs, S1S2 heard, no peripheral edema  Pulm: clear to auscultation bilaterally, normal RR and effort noted  Abdomen: soft, no tenderness, with active bowel sounds. No guarding or palpable hepatosplenomegaly.  Skin; warm and dry, no jaundice or rash  Labs:   ___________________________________________ Radiologic studies:   ____________________________________________ Other: Esophageal biopsies in 2019 without intestinal metaplasia  _____________________________________________ Assessment & Plan  Assessment: Encounter Diagnoses  Name Primary?  . GERD without esophagitis Yes  . Chronic diarrhea   .  Special screening for malignant neoplasms, colon    Chronic GERD without esophagitis on PPI, persistent symptoms despite acid suppression.  Needs to stop smoking for long-term control. Chronic diarrhea, sounds like it may be bile acid diarrhea given the timing after cholecystectomy, and it probably predates her  use of PPI.  Microscopic colitis still a consideration.  Average risk for colorectal cancer screening, now beginning age 50. Plan: Make every effort to stop smoking. Screening colonoscopy.  Biopsies be taken at that time to rule out microscopic colitis. She reports prior difficulty tolerating bowel preparation for attempts at colonoscopy.  Therefore, we will use MiraLAX prep and metoclopramide pretreatment.  Trial of WelChol 625 mg, 1 tablet twice daily.  We will check up with her at time of colonoscopy to see if this is helping.  If not, possibly increased dose or change to Questran  30 minutes were spent on this encounter (including chart review, history/exam, counseling/coordination of care, and documentation)  Nelida Meuse III

## 2019-06-07 NOTE — Progress Notes (Signed)
Subjective:    CC: depression/anxiety follow up  HPI: 49 year old female presenting for follow up on depression/anxiety after starting Zoloft 25mg  4 weeks ago.  Taking medication as prescribed, tolerating well with no side effects.  Reports she does not feel that the medication has helped much.  Her daughter is still living with her causing significant increased stress.  She has started going to her mother's house when things get stressful at home and this is helping.  No SI/HI.  Taking levothyroxine 25 mcg daily as prescribed, tolerating well.  Will be due for follow-up TSH in 2 weeks.  History of occasional increased heart rate accompanied by fatigue and mild shortness of breath with slight chest discomfort.  Over the past 2 weeks has noticed these symptoms are occurring most days in the afternoon after she gets off work.  No previous cardiac work-up but labs drawn at the beginning of February were all normal.  Admits to not eating or drinking while at work all day because of her GI issues.  Symptoms are occurring in the afternoon prior to eating.  Family history of diabetes.  Concerned about weight gain.  Poor nutritional intake related to GI issues and being a picky eater.  No intentional exercise.  Would like to know if we would be able to start medication to help her lose weight.  I reviewed the past medical history, family history, social history, surgical history, and allergies today and no changes were needed.  Please see the problem list section below in epic for further details.  Past Medical History: Past Medical History:  Diagnosis Date  . Anemia    h/o of Bld. Transfusion post C/Section  . Anxiety    panic attacks - several yrs. ago, pt. reports that she is claustrophobic     . Arthritis    shoulders- ? bursitis   . Cervical dysplasia   . GERD (gastroesophageal reflux disease)   . Headache(784.0)   . Irregular heartbeat    pt. states that she has been told that she has an  irreg. heartbeat on occas.  . Ovarian cyst   . Shortness of breath   . Thyroid disease   . Tobacco abuse    Past Surgical History: Past Surgical History:  Procedure Laterality Date  . ABDOMINAL HYSTERECTOMY     bowel repair /w Davinci Robot procedure   . ANTERIOR CERVICAL DECOMP/DISCECTOMY FUSION N/A 01/06/2013   Procedure: ANTERIOR CERVICAL DECOMPRESSION/DISCECTOMY FUSION 2 LEVELS;  Surgeon: Sinclair Ship, MD;  Location: [facility];  Service: Orthopedics;  Laterality: N/A;  Anterior cervical decompression fusion, cervical 5-6, cervical 6-7 with instrumentation and allograft  . CESAREAN SECTION  1994   tubal ligation   . CHOLECYSTECTOMY N/A 09/06/2013   Procedure: LAPAROSCOPIC CHOLECYSTECTOMY WITH INTRAOPERATIVE CHOLANGIOGRAM;  Surgeon: Joyice Faster. Cornett, MD;  Location: [facility];  Service: General;  Laterality: N/A;  . DENTAL SURGERY  02-2013   implants placed  . OOPHORECTOMY    . TONSILLECTOMY     Social History: Social History   Socioeconomic History  . Marital status: Single    Spouse name: Not on file  . Number of children: Not on file  . Years of education: Not on file  . Highest education level: Not on file  Occupational History  . Occupation: Public librarian  Tobacco Use  . Smoking status: Current Every Day Smoker    Packs/day: 1.00    Years: 30.00    Pack years: 30.00    Types: Cigarettes  .  Smokeless tobacco: Never Used  Substance and Sexual Activity  . Alcohol use: No  . Drug use: No  . Sexual activity: Not Currently  Other Topics Concern  . Not on file  Social History Narrative  . Not on file   Social Determinants of Health   Financial Resource Strain:   . Difficulty of Paying Living Expenses: Not on file  Food Insecurity:   . Worried About Charity fundraiser in the Last Year: Not on file  . Ran Out of Food in the Last Year: Not on file  Transportation Needs:   . Lack of Transportation (Medical): Not on file  . Lack of Transportation (Non-Medical): Not  on file  Physical Activity:   . Days of Exercise per Week: Not on file  . Minutes of Exercise per Session: Not on file  Stress:   . Feeling of Stress : Not on file  Social Connections:   . Frequency of Communication with Friends and Family: Not on file  . Frequency of Social Gatherings with Friends and Family: Not on file  . Attends Religious Services: Not on file  . Active Member of Clubs or Organizations: Not on file  . Attends Archivist Meetings: Not on file  . Marital Status: Not on file   Family History: Family History  Problem Relation Age of Onset  . Depression Mother   . Heart attack Father   . Hypertension Father   . Lung cancer Father   . Cervical cancer Sister   . Alcohol abuse Brother   . Diabetes Maternal Grandmother   . Thyroid cancer Sister    Allergies: Allergies  Allergen Reactions  . Morphine And Related Anaphylaxis   Medications: See med rec.  Review of Systems: No fevers, chills, night sweats, weight loss, chest pain, or shortness of breath.   Objective:    General: Well Developed, well nourished, and in no acute distress.  Neuro: Alert and oriented x3. HEENT: Normocephalic, atraumatic.  Skin: Warm and dry. Cardiac: Regular rate and rhythm, no murmurs rubs or gallops, no lower extremity edema.  Respiratory: Clear to auscultation bilaterally. Not using accessory muscles, speaking in full sentences.   Impression and Recommendations:    Depression with anxiety Low suspicion for Zoloft causing palpitations and fatigue in the afternoon. Continue Zoloft 25 mg daily but try taking medication at night to see if this helps with symptoms.  Discussed referral for CBT, declined at this time.  Continue current coping measures.  Subclinical hypothyroidism Continue levothyroxine 25 mcg daily.  Order entered for lab draw in 2 weeks.  Palpitations/fatigue Possible etiology: Cardiac vs. medication side effect vs. hypoglycemia poor intake.  Recent  work-up negative for electrolyte imbalances.  Will try nighttime dosing for Zoloft to see if this helps.  Also recommend eating a snack/small meal during the day or in the afternoon before the time she notices her symptoms usually start.    Weight gain We will hold off on initiating medication at this time.  Discussed lifestyle modifications to include portion control, regular p.o. intake, and intentional exercise.  If this is unsuccessful, once TSH is controlled, we will look at starting medication to help with weight loss.  Follow-up: Return in 2 weeks for blood draw to recheck TSH (lab orders entered).  We will discuss Zoloft effectiveness, continued palpitations/fatigue, and plans for weight loss at that time. _______________________________ Clearnce Sorrel, DNP, APRN, FNP-BC [facility] [place]

## 2019-06-07 NOTE — Patient Instructions (Signed)
Get TSH drawn around 3/16  Work on portion control, intentional exercise  Zoloft at night to see if this helps with symptoms  Try snacking in the afternoon to see if this helps with fatigue/elevated heart rate  We will make a plan once lab results are back regarding weight loss.

## 2019-06-07 NOTE — Patient Instructions (Addendum)
If you are age 50 or older, your body mass index should be between 23-30. Your Body mass index is 29.62 kg/m. If this is out of the aforementioned range listed, please consider follow up with your Primary Care Provider.  If you are age 73 or younger, your body mass index should be between 19-25. Your Body mass index is 29.62 kg/m. If this is out of the aformentioned range listed, please consider follow up with your Primary Care Provider.    We have sent the following medications to your pharmacy for you to pick up at your convenience:  Welchol 625 mg 1 tablet twice daily with a meal.    It was a pleasure to see you today!  Dr. Loletha Carrow

## 2019-06-13 ENCOUNTER — Encounter: Payer: Self-pay | Admitting: Gastroenterology

## 2019-06-20 ENCOUNTER — Ambulatory Visit (INDEPENDENT_AMBULATORY_CARE_PROVIDER_SITE_OTHER): Payer: 59

## 2019-06-20 ENCOUNTER — Other Ambulatory Visit: Payer: Self-pay | Admitting: Gastroenterology

## 2019-06-20 ENCOUNTER — Encounter: Payer: Self-pay | Admitting: Gastroenterology

## 2019-06-20 DIAGNOSIS — Z1159 Encounter for screening for other viral diseases: Secondary | ICD-10-CM

## 2019-06-20 LAB — SARS CORONAVIRUS 2 (TAT 6-24 HRS): SARS Coronavirus 2: NEGATIVE

## 2019-06-22 ENCOUNTER — Ambulatory Visit (AMBULATORY_SURGERY_CENTER): Payer: 59 | Admitting: Gastroenterology

## 2019-06-22 ENCOUNTER — Encounter: Payer: Self-pay | Admitting: Gastroenterology

## 2019-06-22 ENCOUNTER — Other Ambulatory Visit: Payer: Self-pay

## 2019-06-22 VITALS — BP 113/71 | HR 74 | Temp 95.9°F | Resp 18 | Ht 65.0 in | Wt 178.0 lb

## 2019-06-22 DIAGNOSIS — K529 Noninfective gastroenteritis and colitis, unspecified: Secondary | ICD-10-CM

## 2019-06-22 DIAGNOSIS — R197 Diarrhea, unspecified: Secondary | ICD-10-CM

## 2019-06-22 DIAGNOSIS — K5730 Diverticulosis of large intestine without perforation or abscess without bleeding: Secondary | ICD-10-CM

## 2019-06-22 DIAGNOSIS — Z1211 Encounter for screening for malignant neoplasm of colon: Secondary | ICD-10-CM | POA: Diagnosis not present

## 2019-06-22 MED ORDER — SODIUM CHLORIDE 0.9 % IV SOLN: 500.0000 mL | Freq: Once | INTRAVENOUS | Status: DC

## 2019-06-22 MED ORDER — COLESEVELAM HCL 625 MG PO TABS: 625.0000 mg | ORAL_TABLET | Freq: Two times a day (BID) | ORAL | 1 refills | Status: DC

## 2019-06-22 NOTE — Progress Notes (Signed)
PT taken to PACU. Monitors in place. VSS. Report given to RN. 

## 2019-06-22 NOTE — Op Note (Signed)
[facility] Patient Name: Joanne [name] Procedure Date: 06/22/2019 11:36 AM MRN: [id number] Endoscopist: Mallie Mussel L. Loletha Carrow , MD Age: 49 Referring MD:  Date of Birth: May 07, 1970 Gender: Female Account #: [id number] Procedure:                Colonoscopy Indications:              Screening for colorectal malignant neoplasm, This                            is the patient's first colonoscopy, Incidental                            diarrhea noted Medicines:                Monitored Anesthesia Care Procedure:                Pre-Anesthesia Assessment:                           - Prior to the procedure, a History and Physical                            was performed, and patient medications and                            allergies were reviewed. The patient's tolerance of                            previous anesthesia was also reviewed. The risks                            and benefits of the procedure and the sedation                            options and risks were discussed with the patient.                            All questions were answered, and informed consent                            was obtained. Prior Anticoagulants: The patient has                            taken no previous anticoagulant or antiplatelet                            agents. ASA Grade Assessment: II - A patient with                            mild systemic disease. After reviewing the risks                            and benefits, the patient was deemed in  satisfactory condition to undergo the procedure.                           After obtaining informed consent, the colonoscope                            was passed under direct vision. Throughout the                            procedure, the patient's blood pressure, pulse, and                            oxygen saturations were monitored continuously. The                            Colonoscope was introduced through the anus  and                            advanced to the the cecum, identified by                            appendiceal orifice and ileocecal valve. The                            colonoscopy was performed without difficulty. The                            patient tolerated the procedure well. The quality                            of the bowel preparation was good. The ileocecal                            valve, appendiceal orifice, and rectum were                            photographed. The quality of the bowel preparation                            was evaluated using the BBPS [facility] Bowel                            Preparation Scale) with scores of: Right Colon = 2,                            Transverse Colon = 2 and Left Colon = 2. The total                            BBPS score equals 6. The bowel preparation used was                            Miralax. Scope In: 11:44:29 AM Scope Out: 11:57:58 AM Scope Withdrawal Time: 0 hours 10 minutes 49 seconds  Total Procedure Duration: 0 hours 13 minutes 29 seconds  Findings:                 The perianal and digital rectal examinations were                            normal.                           Multiple diverticula were found in the left colon.                           Normal mucosa was found in the entire colon.                            Biopsies for histology were taken with a cold                            forceps from the right colon and left colon for                            evaluation of microscopic colitis.                           There is no endoscopic evidence of polyps in the                            entire colon.                           The retroflexed view of the distal rectum and anal                            verge was normal and showed no anal or rectal                            abnormalities.                           The terminal ileum could not be intubated. Complications:            No immediate  complications. Estimated Blood Loss:     Estimated blood loss was minimal. Impression:               - Diverticulosis in the left colon.                           - Normal mucosa in the entire examined colon.                            Biopsied.                           - The distal rectum and anal verge are normal on  retroflexion view. Recommendation:           - Patient has a contact number available for                            emergencies. The signs and symptoms of potential                            delayed complications were discussed with the                            patient. Return to normal activities tomorrow.                            Written discharge instructions were provided to the                            patient.                           - Resume previous diet.                           - Continue present medications. Try the colesevalam                            that was recently prescribed for chronic diarrhea.                           - Await pathology results.                           - Repeat colonoscopy in 10 years for screening                            purposes. Keera Altidor L. Loletha Carrow, MD 06/22/2019 12:05:07 PM This report has been signed electronically.

## 2019-06-22 NOTE — Progress Notes (Signed)
[facility]

## 2019-06-22 NOTE — Progress Notes (Signed)
Called to room to assist during endoscopic procedure.  Patient ID and intended procedure confirmed with present staff. Received instructions for my participation in the procedure from the performing physician.  

## 2019-06-22 NOTE — Patient Instructions (Addendum)
HANDOUTS PROVIDED ON: DIVERTICULOSIS  The biopsies taken today have been sent for pathology.  The results can take 1-3 weeks to receive.  When your next colonoscopy should occur will be based on the pathology results.    You may resume your previous diet and medication schedule.  Try the Colesevalam that was recently prescribed for chronic diarrhea.  Thank you for allowing [place] to care for you today!!!   YOU HAD AN ENDOSCOPIC PROCEDURE TODAY AT [place]:   Refer to the procedure report that was given to you for any specific questions about what was found during the examination.  If the procedure report does not answer your questions, please call your gastroenterologist to clarify.  If you requested that your care partner not be given the details of your procedure findings, then the procedure report has been included in a sealed envelope for you to review at your convenience later.  YOU SHOULD EXPECT: Some feelings of bloating in the abdomen. Passage of more gas than usual.  Walking can help get rid of the air that was put into your GI tract during the procedure and reduce the bloating. If you had a lower endoscopy (such as a colonoscopy or flexible sigmoidoscopy) you may notice spotting of blood in your stool or on the toilet paper. If you underwent a bowel prep for your procedure, you may not have a normal bowel movement for a few days.  Please Note:  You might notice some irritation and congestion in your nose or some drainage.  This is from the oxygen used during your procedure.  There is no need for concern and it should clear up in a day or so.  SYMPTOMS TO REPORT IMMEDIATELY:   Following lower endoscopy (colonoscopy or flexible sigmoidoscopy):  Excessive amounts of blood in the stool  Significant tenderness or worsening of abdominal pains  Swelling of the abdomen that is new, acute  Fever of 100F or higher  For urgent or emergent issues, a gastroenterologist can be  reached at any hour by calling [phone]. Do not use MyChart messaging for urgent concerns.    DIET:  We do recommend a small meal at first, but then you may proceed to your regular diet.  Drink plenty of fluids but you should avoid alcoholic beverages for 24 hours.  ACTIVITY:  You should plan to take it easy for the rest of today and you should NOT DRIVE or use heavy machinery until tomorrow (because of the sedation medicines used during the test).    FOLLOW UP: Our staff will call the number listed on your records 48-72 hours following your procedure to check on you and address any questions or concerns that you may have regarding the information given to you following your procedure. If we do not reach you, we will leave a message.  We will attempt to reach you two times.  During this call, we will ask if you have developed any symptoms of COVID 19. If you develop any symptoms (ie: fever, flu-like symptoms, shortness of breath, cough etc.) before then, please call [phone].  If you test positive for Covid 19 in the 2 weeks post procedure, please call and report this information to [place].    If any biopsies were taken you will be contacted by phone or by letter within the next 1-3 weeks.  Please call us at [phone] if you have not heard about the biopsies in 3 weeks.    SIGNATURES/CONFIDENTIALITY: You and/or  your care partner have signed paperwork which will be entered into your electronic medical record.  These signatures attest to the fact that that the information above on your After Visit Summary has been reviewed and is understood.  Full responsibility of the confidentiality of this discharge information lies with you and/or your care-partner.

## 2019-06-24 ENCOUNTER — Telehealth: Payer: Self-pay

## 2019-06-24 ENCOUNTER — Telehealth: Payer: Self-pay | Admitting: *Deleted

## 2019-06-24 NOTE — Telephone Encounter (Signed)
No answer for post procedure call back. Left message for patient to call back with questions and concerns.

## 2019-06-24 NOTE — Telephone Encounter (Signed)
Attempted to reach pt. With follow-up call following endoscopic procedure [phone].  LM on pt. Voice mail.  Will try to reach pt. Again later today.

## 2019-06-24 NOTE — Telephone Encounter (Signed)
Fax received from [facility] requesting a Rx for levothyroxine 25 mg. Provider wanted her to return in 2 weeks for repeat labs because she had been off her med for a while. LVM reminding pt that she needed to come in for labs prior to [place] sending in a refill.

## 2019-06-27 ENCOUNTER — Other Ambulatory Visit: Payer: Self-pay

## 2019-06-27 ENCOUNTER — Telehealth: Payer: Self-pay | Admitting: Gastroenterology

## 2019-06-27 DIAGNOSIS — K2270 Barrett's esophagus without dysplasia: Secondary | ICD-10-CM

## 2019-06-27 MED ORDER — PANTOPRAZOLE SODIUM 40 MG PO TBEC: DELAYED_RELEASE_TABLET | ORAL | 1 refills | Status: DC

## 2019-06-27 MED ORDER — CHOLESTYRAMINE 4 G PO PACK: PACK | ORAL | 0 refills | Status: DC

## 2019-06-27 NOTE — Telephone Encounter (Signed)
Notwithstanding the insurance company's nonsensical reasoning, we will try questran 4 gram packet, one twice daily with morning and evening meals. Do no take within 2 hours of other medicines. Disp# 30, RF zero (for a 2 week trial)

## 2019-06-27 NOTE — Telephone Encounter (Signed)
Pantoprazole refilled, patient up to date on visits.

## 2019-06-27 NOTE — Telephone Encounter (Signed)
Please advise on an alternative, in your last note you said that if Welchol wasn't working a possible change to [place] maybe needed.

## 2019-06-27 NOTE — Telephone Encounter (Signed)
Left a detailed voicemail for patient with instructions, Rx has been sent.

## 2019-06-28 ENCOUNTER — Other Ambulatory Visit: Payer: Self-pay | Admitting: Medical-Surgical

## 2019-06-28 DIAGNOSIS — E039 Hypothyroidism, unspecified: Secondary | ICD-10-CM

## 2019-06-28 DIAGNOSIS — E038 Other specified hypothyroidism: Secondary | ICD-10-CM

## 2019-06-28 NOTE — Telephone Encounter (Signed)
Lab order in for TSH to be drawn. Need level checked before any further refills.

## 2019-06-29 ENCOUNTER — Other Ambulatory Visit: Payer: Self-pay | Admitting: Physician Assistant

## 2019-06-29 DIAGNOSIS — K2270 Barrett's esophagus without dysplasia: Secondary | ICD-10-CM

## 2019-07-13 ENCOUNTER — Other Ambulatory Visit: Payer: Self-pay | Admitting: Gastroenterology

## 2019-07-13 DIAGNOSIS — K2270 Barrett's esophagus without dysplasia: Secondary | ICD-10-CM

## 2019-07-20 ENCOUNTER — Other Ambulatory Visit: Payer: Self-pay | Admitting: Gastroenterology

## 2019-07-26 ENCOUNTER — Other Ambulatory Visit: Payer: Self-pay

## 2019-07-26 DIAGNOSIS — E039 Hypothyroidism, unspecified: Secondary | ICD-10-CM

## 2019-07-26 DIAGNOSIS — E038 Other specified hypothyroidism: Secondary | ICD-10-CM

## 2019-07-31 ENCOUNTER — Encounter: Payer: Self-pay | Admitting: Medical-Surgical

## 2019-08-01 ENCOUNTER — Other Ambulatory Visit: Payer: Self-pay | Admitting: Medical-Surgical

## 2019-08-01 DIAGNOSIS — E039 Hypothyroidism, unspecified: Secondary | ICD-10-CM

## 2019-08-01 DIAGNOSIS — E038 Other specified hypothyroidism: Secondary | ICD-10-CM

## 2019-08-09 ENCOUNTER — Other Ambulatory Visit: Payer: Self-pay | Admitting: Medical-Surgical

## 2019-08-09 NOTE — Progress Notes (Signed)
Virtual Visit via Video Note  I connected with [name] on 08/10/19 at  8:10 AM EDT by a video enabled telemedicine application and verified that I am speaking with the correct person using two identifiers.   I discussed the limitations of evaluation and management by telemedicine and the availability of in person appointments. The patient expressed understanding and agreed to proceed.  Subjective:    CC: rhinorrhea, HA, neck pain  HPI: Pleasant 49 year old female presenting via MyChart video visit for complaints of rhinorrhea, HA, and neck pain x 1 week.  HA-intermittent frontal headache involving the right side of forehead and right thigh.  Has experienced this headache every day.  Not accompanied by nausea, photophobia, photophonia, aura, or vision changes.  Has awakened a few mornings with a headache present, one morning had numbness over the area of pain over the forehead.  Has tried Tylenol and Aleve with no relief.  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  Rhinorrhea-clear nasal drainage from the left nare only.  Reports right nare is fine.  Cervicalgia-reports significant neck pain along the posterior cervical spine and cervical paraspinal muscles.  History of previous cervical fusion surgery.  Lifting makes it worse.  Tylenol and Aleve not helping.  Hypothyroidism- TSH rechecked yesterday, still elevated. Taking levothyroxine 70mcg daily as prescribed.   Blood pressure-patient reports that she feels her blood pressure may be elevated.  Checked it at home last week with a reading of systolic [id number].  Past medical history, Surgical history, Family history not pertinant except as noted below, Social history, Allergies, and medications have been entered into the medical record, reviewed, and corrections made.   Review of Systems: No fevers, chills, night sweats, weight  loss, chest pain, or shortness of breath.   Objective:    General: Speaking clearly in complete sentences without any shortness of breath.  Alert and oriented x3.  Normal judgment. No apparent acute distress.  Impression and Recommendations:    1. Subclinical hypothyroidism Increasing levothyroxine to 50 mcg daily.  We will need to recheck TSH in 6 weeks.  Order entered for future lab draw. - levothyroxine (SYNTHROID) 25 MCG tablet; TAKE 1 TABLET BY MOUTH DAILY BEFORE BREAKFAST **DUE FOR LABS/FINAL REFILL**  Dispense: 15 tablet; Refill: 0  2. Allergic rhinitis, unspecified seasonality, unspecified trigger Recommend starting over-the-counter antihistamine such as cetirizine, fexofenadine, or loratadine.  Sending in Atrovent nasal spray for use in the left nare as this seems to be the biggest problem. - ipratropium (ATROVENT) 0.03 % nasal spray; Place 2 sprays into both nostrils every 12 (twelve) hours.  Dispense: 30 mL; Refill: 0  3. Cervicalgia Neck pain not responding to conservative measures.  5-day burst of prednisone which may also help with allergies. - predniSONE (DELTASONE) 50 MG tablet; Take 1 tablet (50 mg total) by mouth daily.  Dispense: 5 tablet; Refill: 0  4. Acute nonintractable headache, unspecified headache type Headache location consistent with migraine but no personal history and no accompanying symptoms. Possibly related to exacerbation of allergies, elevated BP, and/or neck pain. Complete the 5 day course of prednisone and if headaches are still occurring, will need to reevaluate.   Return in about 2 weeks (around 08/24/2019) for Elevated blood pressure follow-up.  35 minutes of non-face-to-face time was provided during this encounter.  I discussed the assessment and treatment plan with the patient. The patient was  provided an opportunity to ask questions and all were answered. The patient agreed with the plan and demonstrated an understanding of the instructions.    The patient was advised to call back or seek an in-person evaluation if the symptoms worsen or if the condition fails to improve as anticipated.  Clearnce Sorrel, DNP, APRN, FNP-BC [facility]

## 2019-08-10 ENCOUNTER — Encounter: Payer: Self-pay | Admitting: Medical-Surgical

## 2019-08-10 ENCOUNTER — Telehealth (INDEPENDENT_AMBULATORY_CARE_PROVIDER_SITE_OTHER): Payer: 59 | Admitting: Medical-Surgical

## 2019-08-10 DIAGNOSIS — J309 Allergic rhinitis, unspecified: Secondary | ICD-10-CM | POA: Diagnosis not present

## 2019-08-10 DIAGNOSIS — E039 Hypothyroidism, unspecified: Secondary | ICD-10-CM | POA: Diagnosis not present

## 2019-08-10 DIAGNOSIS — M542 Cervicalgia: Secondary | ICD-10-CM

## 2019-08-10 DIAGNOSIS — R519 Headache, unspecified: Secondary | ICD-10-CM

## 2019-08-10 DIAGNOSIS — E038 Other specified hypothyroidism: Secondary | ICD-10-CM

## 2019-08-10 LAB — TSH: TSH: 7.41 mIU/L — ABNORMAL HIGH

## 2019-08-10 MED ORDER — IPRATROPIUM BROMIDE 0.03 % NA SOLN: 2.0000 | Freq: Two times a day (BID) | NASAL | 0 refills | Status: DC

## 2019-08-10 MED ORDER — LEVOTHYROXINE SODIUM 25 MCG PO TABS: ORAL_TABLET | ORAL | 0 refills | Status: DC

## 2019-08-10 MED ORDER — LEVOTHYROXINE SODIUM 50 MCG PO TABS: ORAL_TABLET | ORAL | 0 refills | Status: DC

## 2019-08-10 MED ORDER — PREDNISONE 50 MG PO TABS: 50.0000 mg | ORAL_TABLET | Freq: Every day | ORAL | 0 refills | Status: DC

## 2019-08-10 NOTE — Addendum Note (Signed)
Addended bySamuel Bouche on: 08/10/2019 09:05 AM   Modules accepted: Orders

## 2019-08-22 NOTE — Progress Notes (Signed)
Subjective:    CC: Blood pressure check, ear pain  HPI: Pleasant 49 year old female presenting today for blood pressure check, ear pain, and muscle spasm.  Blood pressure check-recently having difficulty with recurrent headaches during a virtual visit.  Unable to check blood pressure at home she has no cuff.  Blood pressure in office today 125/85.  No need for further intervention.  Denies chest pain, shortness of breath, lower extremity edema, palpitations, and dizziness.  Headaches have resolved.  Right ear pain-long history of ear concerns since childhood.  Has been experiencing sharp right ear pain x1 week.  Endorses itching of the ear canal and frequent popping but denies hearing loss, drainage, fever, and chills.  Continues to have rhinorrhea of the left nostril but no other sinus symptoms or congestion.  Reports increase in jaw popping lately.  Muscle spasm-reports daily neck and upper discomfort.  Previous diagnosis of cervical radiculitis.  Has tried over-the-counter pain relievers that do not help.  Previously tried muscle relaxers which did provide some relief.  Would like to try low-dose muscle relaxers again.  Has been using heat and ice with minimal relief.   I reviewed the past medical history, family history, social history, surgical history, and allergies today and no changes were needed.  Please see the problem list section below in epic for further details.  Past Medical History: Past Medical History:  Diagnosis Date  . Anemia    h/o of Bld. Transfusion post C/Section  . Anxiety    panic attacks - several yrs. ago, pt. reports that she is claustrophobic     . Arthritis    shoulders- ? bursitis   . cervical cancer    cervical  . Cervical dysplasia   . GERD (gastroesophageal reflux disease)   . Headache(784.0)   . Hypertension   . Irregular heartbeat    pt. states that she has been told that she has an irreg. heartbeat on occas.  . Ovarian cyst   . Shortness of  breath   . Thyroid disease   . Tobacco abuse    Past Surgical History: Past Surgical History:  Procedure Laterality Date  . ABDOMINAL HYSTERECTOMY     bowel repair /w Davinci Robot procedure   . ANTERIOR CERVICAL DECOMP/DISCECTOMY FUSION N/A 01/06/2013   Procedure: ANTERIOR CERVICAL DECOMPRESSION/DISCECTOMY FUSION 2 LEVELS;  Surgeon: Sinclair Ship, MD;  Location: [place];  Service: Orthopedics;  Laterality: N/A;  Anterior cervical decompression fusion, cervical 5-6, cervical 6-7 with instrumentation and allograft  . CESAREAN SECTION  1994   tubal ligation   . CHOLECYSTECTOMY N/A 09/06/2013   Procedure: LAPAROSCOPIC CHOLECYSTECTOMY WITH INTRAOPERATIVE CHOLANGIOGRAM;  Surgeon: Joyice Faster. Cornett, MD;  Location: [place];  Service: General;  Laterality: N/A;  . DENTAL SURGERY  02-2013   implants placed  . OOPHORECTOMY    . TONSILLECTOMY    . UPPER GASTROINTESTINAL ENDOSCOPY     Social History: Social History   Socioeconomic History  . Marital status: Single    Spouse name: Not on file  . Number of children: 2  . Years of education: Not on file  . Highest education level: Not on file  Occupational History  . Occupation: Public librarian  Tobacco Use  . Smoking status: Current Every Day Smoker    Packs/day: 1.00    Years: 30.00    Pack years: 30.00    Types: Cigarettes  . Smokeless tobacco: Never Used  Substance and Sexual Activity  . Alcohol use: No  . Drug use:  No  . Sexual activity: Not Currently  Other Topics Concern  . Not on file  Social History Narrative  . Not on file   Social Determinants of Health   Financial Resource Strain:   . Difficulty of Paying Living Expenses:   Food Insecurity:   . Worried About Charity fundraiser in the Last Year:   . Arboriculturist in the Last Year:   Transportation Needs:   . Film/video editor (Medical):   Marland Kitchen Lack of Transportation (Non-Medical):   Physical Activity:   . Days of Exercise per Week:   . Minutes of Exercise  per Session:   Stress:   . Feeling of Stress :   Social Connections:   . Frequency of Communication with Friends and Family:   . Frequency of Social Gatherings with Friends and Family:   . Attends Religious Services:   . Active Member of Clubs or Organizations:   . Attends Archivist Meetings:   Marland Kitchen Marital Status:    Family History: Family History  Problem Relation Age of Onset  . Depression Mother   . Heart attack Father   . Hypertension Father   . Lung cancer Father   . Cervical cancer Sister   . Alcohol abuse Brother   . Diabetes Maternal Grandmother   . Colon cancer Maternal Grandmother   . Thyroid cancer Sister   . Esophageal cancer Neg Hx   . Rectal cancer Neg Hx   . Stomach cancer Neg Hx    Allergies: Allergies  Allergen Reactions  . Morphine And Related Anaphylaxis   Medications: See med rec.  Review of Systems: See HPI for pertinent positives and negatives.  Objective:    General: Well Developed, well nourished, and in no acute distress.  Neuro: Alert and oriented x3.  HEENT: Normocephalic, atraumatic.  Skin: Warm and dry. Cardiac: Regular rate and rhythm, no murmurs rubs or gallops, no lower extremity edema.  Respiratory: Clear to auscultation bilaterally. Not using accessory muscles, speaking in full sentences. Cervical spine/upper back: No areas of point tenderness identified but paraspinal muscles tense.  Impression and Recommendations:    1. Elevated blood-pressure reading without diagnosis of hypertension Normal blood pressure in office.  Headaches have resolved.  We will just continue to watch blood pressure at subsequent appointments.  Recommend continuing to monitor dietary sodium and stay well-hydrated.  2. Right ear pain Likely related to eustachian tube dysfunction and middle ear effusion however itching along with pain suspicious for otitis externa.  Sending in Cortisporin otic drops.  Patient advised to place 3 drops in the right ear  4 times daily to see if this helps with pain. Recommend daily antihistamine. - NEOMYCIN-POLYMYXIN-HYDROCORTISONE (CORTISPORIN) 1 % SOLN OTIC solution; Place 3 drops into the right ear 4 (four) times daily.  Dispense: 10 mL; Refill: 0  3. Muscle spasm Flexeril 5 to 10 mg 3 times daily as needed.  Advised patient this will cause some sedation and she should avoid use when working.  Continue using ice/heat.  Recommend gentle stretches as well as massage. - cyclobenzaprine (FLEXERIL) 10 MG tablet; Take 1 tablet (10 mg total) by mouth 3 (three) times daily as needed for muscle spasms.  Dispense: 30 tablet; Refill: 0  Return for lab draw for TSH on 6/16 . ___________________________________________ Clearnce Sorrel, DNP, APRN, FNP-BC [facility]

## 2019-08-23 ENCOUNTER — Ambulatory Visit (INDEPENDENT_AMBULATORY_CARE_PROVIDER_SITE_OTHER): Payer: 59 | Admitting: Medical-Surgical

## 2019-08-23 ENCOUNTER — Encounter: Payer: Self-pay | Admitting: Medical-Surgical

## 2019-08-23 VITALS — BP 125/85 | HR 85 | Temp 97.8°F | Ht 65.0 in | Wt 173.6 lb

## 2019-08-23 DIAGNOSIS — M62838 Other muscle spasm: Secondary | ICD-10-CM | POA: Diagnosis not present

## 2019-08-23 DIAGNOSIS — R030 Elevated blood-pressure reading, without diagnosis of hypertension: Secondary | ICD-10-CM | POA: Diagnosis not present

## 2019-08-23 DIAGNOSIS — H9201 Otalgia, right ear: Secondary | ICD-10-CM

## 2019-08-23 MED ORDER — NEOMYCIN-POLYMYXIN-HC 1 % OT SOLN: 3.0000 [drp] | Freq: Four times a day (QID) | OTIC | 0 refills | Status: DC

## 2019-08-23 MED ORDER — CYCLOBENZAPRINE HCL 10 MG PO TABS: 10.0000 mg | ORAL_TABLET | Freq: Three times a day (TID) | ORAL | 0 refills | Status: DC | PRN

## 2019-10-11 ENCOUNTER — Other Ambulatory Visit: Payer: Self-pay | Admitting: Gastroenterology

## 2019-10-11 DIAGNOSIS — K2270 Barrett's esophagus without dysplasia: Secondary | ICD-10-CM

## 2019-11-22 ENCOUNTER — Emergency Department (INDEPENDENT_AMBULATORY_CARE_PROVIDER_SITE_OTHER)
Admission: EM | Admit: 2019-11-22 | Discharge: 2019-11-22 | Disposition: A | Discharge status: Home / Self Care | Payer: Self-pay | Source: Home / Self Care | Attending: Family Medicine | Admitting: Family Medicine

## 2019-11-22 ENCOUNTER — Other Ambulatory Visit: Payer: Self-pay

## 2019-11-22 DIAGNOSIS — R05 Cough: Secondary | ICD-10-CM

## 2019-11-22 DIAGNOSIS — Z20822 Contact with and (suspected) exposure to covid-19: Secondary | ICD-10-CM

## 2019-11-22 DIAGNOSIS — R059 Cough, unspecified: Secondary | ICD-10-CM

## 2019-11-22 DIAGNOSIS — J069 Acute upper respiratory infection, unspecified: Secondary | ICD-10-CM

## 2019-11-22 MED ORDER — DOXYCYCLINE HYCLATE 100 MG PO CAPS
ORAL_CAPSULE | ORAL | 0 refills | Status: DC
Start: 2019-11-22 — End: 2020-02-29

## 2019-11-22 NOTE — Discharge Instructions (Addendum)
Take plain guaifenesin (1200mg  extended release tabs such as Mucinex) twice daily, with plenty of water, for cough and congestion.  May add Pseudoephedrine (30mg , one or two every 4 to 6 hours) for sinus congestion.  Get adequate rest.   May use Afrin nasal spray (or generic oxymetazoline) each morning for about 5 days and then discontinue.  Also recommend using saline nasal spray several times daily and saline nasal irrigation (AYR is a common brand).  Use Flonase nasal spray each morning after using Afrin nasal spray and saline nasal irrigation. Try warm salt water gargles for sore throat.  Stop all antihistamines for now, and other non-prescription cough/cold preparations. May take Ibuprofen 200mg , 4 tabs every 8 hours with food for body aches, headache, etc. May take Delsym Cough Suppressant ("12 Hour Cough Relief") at bedtime for nighttime cough.  May continue using albuterol by nebulizer if helpful. Recommend obtaining a pulse oximeter (oxygen meter) and check your oxygen regularly.  Go to an emergency room if oxygen begins decreasing and you develop increasing shortness of breath.   Isolate yourself until COVID-19 test result is available.   If your COVID19 test is positive, then you are infected with the novel coronavirus and could give the virus to others.  Please continue isolation at home for at least 10 days since the start of your symptoms.  Once you complete your 10 day quarantine, you may return to normal activities as long as you've not had a fever for over 24 hours (without taking fever reducing medicine) and your symptoms are improving. Please continue good preventive care measures, including:  frequent hand-washing, avoid touching your face, cover coughs/sneezes, stay out of crowds and keep a 6 foot distance from others.  Go to the nearest hospital emergency room if fever/cough/breathlessness are severe or illness seems like a threat to life.

## 2019-11-22 NOTE — ED Provider Notes (Signed)
[name]    CSN: [id number] Arrival date & time: 11/22/19  1654      History   Chief Complaint Chief Complaint  Patient presents with  . Cough    HPI DUYEN BECKOM is a 49 y.o. female.   Patient complains of three day history of typical cold-like symptoms developing over several days, including mild sore throat, sinus congestion, headache, fatigue, fever to 101.2, and cough.  Yesterday she developed wheezing improved with her albuterol inhaler.   She denies chest tightness, shortness of breath, and changes in taste/smell. She continues to smoke.    The history is provided by the patient.    Past Medical History:  Diagnosis Date  . Anemia    h/o of Bld. Transfusion post C/Section  . Anxiety    panic attacks - several yrs. ago, pt. reports that she is claustrophobic     . Arthritis    shoulders- ? bursitis   . cervical cancer    cervical  . Cervical dysplasia   . GERD (gastroesophageal reflux disease)   . Headache(784.0)   . Hypertension   . Irregular heartbeat    pt. states that she has been told that she has an irreg. heartbeat on occas.  . Ovarian cyst   . Shortness of breath   . Thyroid disease   . Tobacco abuse     Patient Active Problem List   Diagnosis Date Noted  . Skin tag 05/10/2019  . Depression with anxiety 05/10/2019  . Sleep disturbance 05/10/2019  . Pain of right sacroiliac joint 05/20/2018  . Right knee pain 05/20/2018  . Family history of lung cancer 04/02/2018  . Subclinical hypothyroidism 06/24/2017  . History of cervical dysplasia 06/24/2017  . History of hysterectomy for indication other than cancer 06/24/2017  . Arthralgia 06/24/2017  . Polyphagia 06/24/2017  . Barrett's esophagus determined by endoscopy 06/23/2017  . Esophagitis, erosive 12/19/2014  . Mass of left axilla 04/03/2014  . Excessive daytime sleepiness 03/14/2014  . Abdominal pain, epigastric 03/14/2014  . Annual physical exam 01/17/2014  . Tobacco  use disorder 11/17/2013  . Obesity 08/24/2013  . RUQ pain 06/24/2013  . Right lumbar radiculitis 06/07/2013  . Venous stasis dermatitis 12/09/2012  . Insomnia 11/24/2012  . Major depression 11/24/2012  . Menopausal symptoms 11/24/2012  . Dysplasia of cervix 11/24/2012  . Degenerative disc disease, cervical 11/12/2012    Past Surgical History:  Procedure Laterality Date  . ABDOMINAL HYSTERECTOMY     bowel repair /w Davinci Robot procedure   . ANTERIOR CERVICAL DECOMP/DISCECTOMY FUSION N/A 01/06/2013   Procedure: ANTERIOR CERVICAL DECOMPRESSION/DISCECTOMY FUSION 2 LEVELS;  Surgeon: Sinclair Ship, MD;  Location: [place];  Service: Orthopedics;  Laterality: N/A;  Anterior cervical decompression fusion, cervical 5-6, cervical 6-7 with instrumentation and allograft  . CESAREAN SECTION  1994   tubal ligation   . CHOLECYSTECTOMY N/A 09/06/2013   Procedure: LAPAROSCOPIC CHOLECYSTECTOMY WITH INTRAOPERATIVE CHOLANGIOGRAM;  Surgeon: Joyice Faster. Cornett, MD;  Location: [place];  Service: General;  Laterality: N/A;  . DENTAL SURGERY  02-2013   implants placed  . OOPHORECTOMY    . TONSILLECTOMY    . UPPER GASTROINTESTINAL ENDOSCOPY      OB History    Gravida  2   Para  2   Term      Preterm      AB      Living        SAB      TAB  Ectopic      Multiple      Live Births               Home Medications    Prior to Admission medications   Medication Sig Start Date End Date Taking? Authorizing Provider  levothyroxine (SYNTHROID) 50 MCG tablet TAKE 1 TABLET BY MOUTH DAILY BEFORE BREAKFAST 08/10/19  Yes Jessup, Joy, NP  pantoprazole (PROTONIX) 40 MG tablet TAKE 1 TABLET BY MOUTH  TWICE DAILY BEFORE MEALS 10/11/19  Yes Danis, Estill Cotta III, MD  sertraline (ZOLOFT) 25 MG tablet TAKE 1 TABLET BY MOUTH EVERY DAY 06/01/19  Yes Samuel Bouche, NP  cholestyramine (QUESTRAN) 4 g packet ONE PACKET TWICE A DAY WITH MEALS, DO NOT TAKE WITHIN 2 HOURS OF OTHER MEDICATIONS 07/20/19   Doran Stabler, MD  cyclobenzaprine (FLEXERIL) 10 MG tablet Take 1 tablet (10 mg total) by mouth 3 (three) times daily as needed for muscle spasms. 08/23/19   Samuel Bouche, NP  diphenhydramine-acetaminophen (TYLENOL PM) 25-500 MG TABS tablet Take 1 tablet by mouth at bedtime as needed.    [provider]  doxycycline (VIBRAMYCIN) 100 MG capsule Take one cap PO Q12hr with food. 11/22/19   Kandra Nicolas, MD  gabapentin (NEURONTIN) 300 MG capsule Take 300 mg by mouth daily as needed.    [provider]  ipratropium (ATROVENT) 0.03 % nasal spray Place 2 sprays into both nostrils every 12 (twelve) hours. 08/10/19   Samuel Bouche, NP  NEOMYCIN-POLYMYXIN-HYDROCORTISONE (CORTISPORIN) 1 % SOLN OTIC solution Place 3 drops into the right ear 4 (four) times daily. 08/23/19   Samuel Bouche, NP  nicotine (NICODERM CQ - DOSED IN MG/24 HOURS) 21 mg/24hr patch Place 21 mg onto the skin daily. 06/20/19   [provider]    Family History Family History  Problem Relation Age of Onset  . Depression Mother   . Heart attack Father   . Hypertension Father   . Lung cancer Father   . Cervical cancer Sister   . Alcohol abuse Brother   . Diabetes Maternal Grandmother   . Colon cancer Maternal Grandmother   . Thyroid cancer Sister   . Esophageal cancer Neg Hx   . Rectal cancer Neg Hx   . Stomach cancer Neg Hx     Social History Social History   Tobacco Use  . Smoking status: Current Every Day Smoker    Packs/day: 1.00    Years: 30.00    Pack years: 30.00    Types: Cigarettes  . Smokeless tobacco: Never Used  . Tobacco comment: cut back from 2 ppd  Vaping Use  . Vaping Use: Never used  Substance Use Topics  . Alcohol use: No  . Drug use: No     Allergies   Morphine and related   Review of Systems Review of Systems  + sore throat + cough No pleuritic pain + wheezing + nasal congestion + post-nasal drainage No sinus pain/pressure No itchy/red eyes No earache No  hemoptysis No SOB + fever, + chills No nausea No vomiting No abdominal pain No diarrhea No urinary symptoms No skin rash + fatigue + myalgias + headache Used OTC meds (Claritin, Benadryl) without relief    Physical Exam Triage Vital Signs ED Triage Vitals  Enc Vitals Group     BP 11/22/19 1740 115/80     Pulse Rate 11/22/19 1740 96     Resp 11/22/19 1740 16     Temp 11/22/19 1740 98.2  F (36.8 C)     Temp Source 11/22/19 1740 Oral     SpO2 11/22/19 1740 100 %     Weight 11/22/19 1738 171 lb (77.6 kg)     Height 11/22/19 1738 5\' 5"  (1.651 m)     Head Circumference --      Peak Flow --      Pain Score 11/22/19 1737 4     Pain Loc --      Pain Edu? --      Excl. in [place]? --    No data found.  Updated Vital Signs BP 115/80 (BP Location: Left Arm)   Pulse 96   Temp 98.2 F (36.8 C) (Oral)   Resp 16   Ht 5\' 5"  (1.651 m)   Wt 77.6 kg   SpO2 100%   BMI 28.46 kg/m   Visual Acuity Right Eye Distance:   Left Eye Distance:   Bilateral Distance:    Right Eye Near:   Left Eye Near:    Bilateral Near:     Physical Exam Nursing notes and Vital Signs reviewed. Appearance:  Patient appears stated age, and in no acute distress Eyes:  Pupils are equal, round, and reactive to light and accomodation.  Extraocular movement is intact.  Conjunctivae are not inflamed  Ears:  Canals normal.  Tympanic membranes normal.  Nose:  Mildly congested turbinates.  No sinus tenderness.  Pharynx:  Normal Neck:  Supple.  Mildly enlarged lateral nodes are present, tender to palpation on the left.   Lungs:  Clear to auscultation.  Breath sounds are equal.  Moving air well. Heart:  Regular rate and rhythm without murmurs, rubs, or gallops.  Abdomen:  Nontender without masses or hepatosplenomegaly.  Bowel sounds are present.  No CVA or flank tenderness.  Extremities:  No edema.  Skin:  No rash present.   UC Treatments / Results  Labs (all labs ordered are listed, but only abnormal  results are displayed) Labs Reviewed  NOVEL CORONAVIRUS, NAA    EKG   Radiology No results found.  Procedures Procedures (including critical care time)  Medications Ordered in UC Medications - No data to display  Initial Impression / Assessment and Plan / UC Course  I have reviewed the triage vital signs and the nursing notes.  Pertinent labs & imaging results that were available during my care of the patient were reviewed by me and considered in my medical decision making (see chart for details).    Patient is a smoker.  Will begin empiric doxycycline. COVID19 PCR pending.   Final Clinical Impressions(s) / UC Diagnoses   Final diagnoses:  Viral URI with cough  Exposure to COVID-19 virus  Cough     Discharge Instructions     Take plain guaifenesin (1200mg  extended release tabs such as Mucinex) twice daily, with plenty of water, for cough and congestion.  May add Pseudoephedrine (30mg , one or two every 4 to 6 hours) for sinus congestion.  Get adequate rest.   May use Afrin nasal spray (or generic oxymetazoline) each morning for about 5 days and then discontinue.  Also recommend using saline nasal spray several times daily and saline nasal irrigation (AYR is a common brand).  Use Flonase nasal spray each morning after using Afrin nasal spray and saline nasal irrigation. Try warm salt water gargles for sore throat.  Stop all antihistamines for now, and other non-prescription cough/cold preparations. May take Ibuprofen 200mg , 4 tabs every 8 hours with food for body aches,  headache, etc. May take Delsym Cough Suppressant ("12 Hour Cough Relief") at bedtime for nighttime cough.  May continue using albuterol by nebulizer if helpful. Recommend obtaining a pulse oximeter (oxygen meter) and check your oxygen regularly.  Go to an emergency room if oxygen begins decreasing and you develop increasing shortness of breath.   Isolate yourself until COVID-19 test result is available.    If your COVID19 test is positive, then you are infected with the novel coronavirus and could give the virus to others.  Please continue isolation at home for at least 10 days since the start of your symptoms.  Once you complete your 10 day quarantine, you may return to normal activities as long as you've not had a fever for over 24 hours (without taking fever reducing medicine) and your symptoms are improving. Please continue good preventive care measures, including:  frequent hand-washing, avoid touching your face, cover coughs/sneezes, stay out of crowds and keep a 6 foot distance from others.  Go to the nearest hospital emergency room if fever/cough/breathlessness are severe or illness seems like a threat to life.     ED Prescriptions    Medication Sig Dispense Auth. Provider   doxycycline (VIBRAMYCIN) 100 MG capsule Take one cap PO Q12hr with food. 14 capsule Kandra Nicolas, MD        Kandra Nicolas, MD 11/28/19 [phone]

## 2019-11-22 NOTE — ED Triage Notes (Signed)
Patient presents to Urgent Care with complaints of cough, congestion, headache, and sinus drainage since 2 days ago. Patient reports she feels like she has been having fevers, is a smoker and knows that does not help.  Pt donated plasma today, unsure if that is playing into how poorly she feels. Pt has not been vaccinated for covid, nephew just tested positive for it.

## 2019-11-24 ENCOUNTER — Encounter: Payer: Self-pay | Admitting: Medical-Surgical

## 2019-11-24 LAB — SARS-COV-2, NAA 2 DAY TAT

## 2019-11-24 LAB — NOVEL CORONAVIRUS, NAA: SARS-CoV-2, NAA: NOT DETECTED

## 2019-12-23 ENCOUNTER — Other Ambulatory Visit: Payer: Self-pay | Admitting: Medical-Surgical

## 2019-12-23 DIAGNOSIS — E038 Other specified hypothyroidism: Secondary | ICD-10-CM

## 2020-01-04 ENCOUNTER — Other Ambulatory Visit: Payer: Self-pay

## 2020-01-04 DIAGNOSIS — H9201 Otalgia, right ear: Secondary | ICD-10-CM

## 2020-01-04 DIAGNOSIS — F418 Other specified anxiety disorders: Secondary | ICD-10-CM

## 2020-01-04 DIAGNOSIS — E038 Other specified hypothyroidism: Secondary | ICD-10-CM

## 2020-01-04 MED ORDER — NEOMYCIN-POLYMYXIN-HC 1 % OT SOLN: 3.0000 [drp] | Freq: Four times a day (QID) | OTIC | 0 refills | Status: DC | PRN

## 2020-01-04 MED ORDER — LEVOTHYROXINE SODIUM 50 MCG PO TABS: 50.0000 ug | ORAL_TABLET | Freq: Every day | ORAL | 0 refills | Status: DC

## 2020-01-04 MED ORDER — SERTRALINE HCL 25 MG PO TABS: 25.0000 mg | ORAL_TABLET | Freq: Every day | ORAL | 0 refills | Status: DC

## 2020-01-04 NOTE — Telephone Encounter (Signed)
Pt called and said that she now works for the [employer] and that she is currently without insurance. She said that she was told insurance would take effect around 02/20/2020. She needs refills of her medications and has found a new pharmacy that she will be able to get her Rx's through for a lot cheaper than anywhere else she has been able to price. The new pharmacy is [facility] mail order [facility] out of [place], [place]. She needs refills of sertraline 25 mg, levothyroxine 50 mg, and neomycin-polymyxin-hydrocortisone ear drops. I told her that she is due for an office visit and she was agreeable to scheduling an appt for a time after 02/20/2020 when she will have insurance. An OV was scheduled for her on 02/29/2020 at 8:10 with an 8:00 arrival time. I sent in #90 day refill supply for the above medications to bridge her until she can come in to be seen on 02/29/2020. Pt did not have any further questions or concerns at this time.

## 2020-01-10 ENCOUNTER — Other Ambulatory Visit: Payer: Self-pay

## 2020-01-10 DIAGNOSIS — K2270 Barrett's esophagus without dysplasia: Secondary | ICD-10-CM

## 2020-01-10 MED ORDER — PANTOPRAZOLE SODIUM 40 MG PO TBEC: DELAYED_RELEASE_TABLET | ORAL | 0 refills | Status: DC

## 2020-02-28 NOTE — Progress Notes (Signed)
Subjective:    CC: Hypothyroid, insomnia, mood follow up   HPI: Pleasant 49 year old female presenting today for the following:  Subclinical hypothyroidism-currently taking levothyroxine 50 mcg daily.  She is well overdue for her thyroid levels but insurance was a factor in that.  She does have new insurance but does not have records yet so wants to know if she can have her blood drawn here today.  Denies weight changes, heat/cold intolerance, hair loss, skin changes.  Insomnia-continues to have difficulty sleeping at night.  She does take a Tylenol PM before bed.  Even with this, she wakes up 3-4 times per night.  Sometimes she wakes up feeling as if she has to urinate badly but only has a couple of drops come out.  She does not stop drinking water around 8 PM at night and goes to bed around 9 to 9:30 PM.   Mood-was previously taking sertraline 25 mg daily.  She did run out of this medicine while waiting for refills.  Notes that she has stopped taking it altogether and feels pretty good about her mood.  Does not feel like she needs to restart the medication at this time.  Foot pain-endorses bilateral foot pain worse in the evenings after spending her days at work.  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  Notes that her feet hurt in the arches with the left being worse than the right.  She did have imaging on the right foot a couple of years ago which shows she had 5 different bone spurs present.  Has not had imaging on the left foot.  Has not tried any particular interventions.  I reviewed the past medical history, family history, social history, surgical history, and allergies today and no changes were needed.  Please see the problem list section below in epic for further details.  Past Medical History: Past Medical History:  Diagnosis Date  . Anemia    h/o of Bld. Transfusion post C/Section  . Anxiety    panic attacks - several  yrs. ago, pt. reports that she is claustrophobic     . Arthritis    shoulders- ? bursitis   . cervical cancer    cervical  . Cervical dysplasia   . GERD (gastroesophageal reflux disease)   . Headache(784.0)   . Hypertension   . Irregular heartbeat    pt. states that she has been told that she has an irreg. heartbeat on occas.  . Ovarian cyst   . Shortness of breath   . Thyroid disease   . Tobacco abuse    Past Surgical History: Past Surgical History:  Procedure Laterality Date  . ABDOMINAL HYSTERECTOMY     bowel repair /w Davinci Robot procedure   . ANTERIOR CERVICAL DECOMP/DISCECTOMY FUSION N/A 01/06/2013   Procedure: ANTERIOR CERVICAL DECOMPRESSION/DISCECTOMY FUSION 2 LEVELS;  Surgeon: Sinclair Ship, MD;  Location: [facility];  Service: Orthopedics;  Laterality: N/A;  Anterior cervical decompression fusion, cervical 5-6, cervical 6-7 with instrumentation and allograft  . CESAREAN SECTION  1994   tubal ligation   . CHOLECYSTECTOMY N/A 09/06/2013   Procedure: LAPAROSCOPIC CHOLECYSTECTOMY WITH INTRAOPERATIVE CHOLANGIOGRAM;  Surgeon: Joyice Faster. Cornett, MD;  Location: [facility];  Service: General;  Laterality: N/A;  . DENTAL SURGERY  02-2013   implants placed  . OOPHORECTOMY    . TONSILLECTOMY    . UPPER GASTROINTESTINAL ENDOSCOPY     Social History: Social History   Socioeconomic History  .  Marital status: Single    Spouse name: Not on file  . Number of children: 2  . Years of education: Not on file  . Highest education level: Not on file  Occupational History  . Occupation: Public librarian  Tobacco Use  . Smoking status: Current Every Day Smoker    Packs/day: 1.00    Years: 30.00    Pack years: 30.00    Types: Cigarettes  . Smokeless tobacco: Never Used  . Tobacco comment: cut back from 2 ppd  Vaping Use  . Vaping Use: Never used  Substance and Sexual Activity  . Alcohol use: No  . Drug use: No  . Sexual activity: Not Currently  Other Topics Concern  . Not on file   Social History Narrative  . Not on file   Social Determinants of Health   Financial Resource Strain:   . Difficulty of Paying Living Expenses: Not on file  Food Insecurity:   . Worried About Charity fundraiser in the Last Year: Not on file  . Ran Out of Food in the Last Year: Not on file  Transportation Needs:   . Lack of Transportation (Medical): Not on file  . Lack of Transportation (Non-Medical): Not on file  Physical Activity:   . Days of Exercise per Week: Not on file  . Minutes of Exercise per Session: Not on file  Stress:   . Feeling of Stress : Not on file  Social Connections:   . Frequency of Communication with Friends and Family: Not on file  . Frequency of Social Gatherings with Friends and Family: Not on file  . Attends Religious Services: Not on file  . Active Member of Clubs or Organizations: Not on file  . Attends Archivist Meetings: Not on file  . Marital Status: Not on file   Family History: Family History  Problem Relation Age of Onset  . Depression Mother   . Heart attack Father   . Hypertension Father   . Lung cancer Father   . Cervical cancer Sister   . Alcohol abuse Brother   . Diabetes Maternal Grandmother   . Colon cancer Maternal Grandmother   . Thyroid cancer Sister   . Esophageal cancer Neg Hx   . Rectal cancer Neg Hx   . Stomach cancer Neg Hx    Allergies: Allergies  Allergen Reactions  . Morphine And Related Anaphylaxis   Medications: See med rec.  Review of Systems: See HPI for pertinent positives and negatives.   Objective:    General: Well Developed, well nourished, and in no acute distress.  Neuro: Alert and oriented x3.  HEENT: Normocephalic, atraumatic.  Skin: Warm and dry. Cardiac: Regular rate and rhythm, no murmurs rubs or gallops, no lower extremity edema.  Respiratory: Clear to auscultation bilaterally. Not using accessory muscles, speaking in full sentences.   Impression and Recommendations:    1.  Subclinical hypothyroidism Checking TSH today.  We did draw this in our practice because of her insurance concerns.  Once results are available, we will adjust medications as needed. - TSH  2. Sleep disturbance Recommend good sleep hygiene.  Consider using melatonin.  Okay to use Tylenol PM as needed.  Seems to be having some bladder spasms at night so sending in Ditropan to see if this will help with those.  3. Depression with anxiety Stable off medication.  She will let me know if she feels the need to go back on medication or start counseling.  4. Left foot pain We will get x-rays of her left foot today.  If she has spurs in her bilateral feet, she may benefit most from getting custom orthotics made.  In the meantime, it may be beneficial to look into Dr. Felicie Morn insoles.  Also recommend lower extremity exercises to build strength and endurance for climbing stairs and completing the more physical requirements of her job. - DG Foot Complete Left; Future  Return in about 6 months (around 08/28/2020) for thyroid/mood follow up. ___________________________________________ Clearnce Sorrel, DNP, APRN, FNP-BC Primary Care and [place]

## 2020-02-29 ENCOUNTER — Ambulatory Visit (INDEPENDENT_AMBULATORY_CARE_PROVIDER_SITE_OTHER): Payer: Self-pay | Admitting: Medical-Surgical

## 2020-02-29 ENCOUNTER — Ambulatory Visit (INDEPENDENT_AMBULATORY_CARE_PROVIDER_SITE_OTHER): Payer: Self-pay

## 2020-02-29 ENCOUNTER — Encounter: Payer: Self-pay | Admitting: Medical-Surgical

## 2020-02-29 ENCOUNTER — Other Ambulatory Visit: Payer: Self-pay

## 2020-02-29 VITALS — BP 109/73 | HR 69 | Temp 97.8°F | Ht 65.0 in | Wt 175.9 lb

## 2020-02-29 DIAGNOSIS — E038 Other specified hypothyroidism: Secondary | ICD-10-CM

## 2020-02-29 DIAGNOSIS — M79672 Pain in left foot: Secondary | ICD-10-CM

## 2020-02-29 DIAGNOSIS — F418 Other specified anxiety disorders: Secondary | ICD-10-CM

## 2020-02-29 DIAGNOSIS — G479 Sleep disorder, unspecified: Secondary | ICD-10-CM

## 2020-02-29 MED ORDER — OXYBUTYNIN CHLORIDE 5 MG PO TABS: 5.0000 mg | ORAL_TABLET | Freq: Three times a day (TID) | ORAL | 1 refills | Status: DC | PRN

## 2020-03-01 LAB — TSH: TSH: 6.26 mIU/L — ABNORMAL HIGH

## 2020-03-03 MED ORDER — LEVOTHYROXINE SODIUM 75 MCG PO TABS: 75.0000 ug | ORAL_TABLET | Freq: Every day | ORAL | 1 refills | Status: DC

## 2020-03-03 NOTE — Addendum Note (Signed)
Addended bySamuel Bouche on: 03/03/2020 10:14 AM   Modules accepted: Orders

## 2020-04-30 ENCOUNTER — Ambulatory Visit: Payer: Self-pay

## 2020-04-30 ENCOUNTER — Emergency Department
Admission: RE | Admit: 2020-04-30 | Discharge status: Absent without leave | Payer: Medicaid Other | Source: Ambulatory Visit

## 2020-05-02 ENCOUNTER — Emergency Department (INDEPENDENT_AMBULATORY_CARE_PROVIDER_SITE_OTHER)
Admission: EM | Admit: 2020-05-02 | Discharge: 2020-05-02 | Disposition: A | Discharge status: Home / Self Care | Payer: 59 | Source: Home / Self Care

## 2020-05-02 DIAGNOSIS — L089 Local infection of the skin and subcutaneous tissue, unspecified: Secondary | ICD-10-CM

## 2020-05-02 DIAGNOSIS — M549 Dorsalgia, unspecified: Secondary | ICD-10-CM

## 2020-05-02 DIAGNOSIS — S46911A Strain of unspecified muscle, fascia and tendon at shoulder and upper arm level, right arm, initial encounter: Secondary | ICD-10-CM | POA: Diagnosis not present

## 2020-05-02 MED ORDER — METHOCARBAMOL 500 MG PO TABS: 500.0000 mg | ORAL_TABLET | Freq: Two times a day (BID) | ORAL | 0 refills | Status: DC

## 2020-05-02 MED ORDER — MUPIROCIN 2 % EX OINT: TOPICAL_OINTMENT | CUTANEOUS | 0 refills | Status: DC

## 2020-05-02 MED ORDER — PREDNISONE 50 MG PO TABS: 50.0000 mg | ORAL_TABLET | Freq: Every day | ORAL | 0 refills | Status: AC

## 2020-05-02 NOTE — Discharge Instructions (Signed)
  Robaxin (methocarbamol) is a muscle relaxer and may cause drowsiness. Do not drink alcohol, drive, or operate heavy machinery while taking.  Call to schedule a follow up appointment with sports medicine next week if not improving.

## 2020-05-02 NOTE — ED Triage Notes (Signed)
Patient presents to Urgent Care with complaints of right shoulder pain since about 10 days ago. Patient reports she cannot think of anything she could have done to injure it, states it keeps her up at night, would like work note for today.

## 2020-05-02 NOTE — ED Provider Notes (Signed)
[name]    CSN: [id number] Arrival date & time: 05/02/20  1504      History   Chief Complaint Chief Complaint  Patient presents with  . Shoulder Pain    Right    HPI Joanne [name] is a 50 y.o. female.   HPI  Joanne [name] is a 50 y.o. female presenting to UC with c/o Right side shoulder pain that started 10 days ago. Pain is aching and sore, worse when lifting items at work but no specific injury.  She has used ice without relief.  Denies numbness or weakness in her arm or hands. Pt requesting a note for work.   Pt also reports having a sore cyst/wound on left side of neck that her family member "tried to pop" yesterday. Today, she noticed a small amount of green discharge.   Past Medical History:  Diagnosis Date  . Anemia    h/o of Bld. Transfusion post C/Section  . Anxiety    panic attacks - several yrs. ago, pt. reports that she is claustrophobic     . Arthritis    shoulders- ? bursitis   . cervical cancer    cervical  . Cervical dysplasia   . GERD (gastroesophageal reflux disease)   . Headache(784.0)   . Hypertension   . Irregular heartbeat    pt. states that she has been told that she has an irreg. heartbeat on occas.  . Ovarian cyst   . Shortness of breath   . Thyroid disease   . Tobacco abuse     Patient Active Problem List   Diagnosis Date Noted  . Skin tag 05/10/2019  . Depression with anxiety 05/10/2019  . Sleep disturbance 05/10/2019  . Pain of right sacroiliac joint 05/20/2018  . Right knee pain 05/20/2018  . Family history of lung cancer 04/02/2018  . Subclinical hypothyroidism 06/24/2017  . History of cervical dysplasia 06/24/2017  . History of hysterectomy for indication other than cancer 06/24/2017  . Arthralgia 06/24/2017  . Polyphagia 06/24/2017  . Barrett's esophagus determined by endoscopy 06/23/2017  . Esophagitis, erosive 12/19/2014  . Mass of left axilla 04/03/2014  . Excessive daytime sleepiness 03/14/2014   . Abdominal pain, epigastric 03/14/2014  . Annual physical exam 01/17/2014  . Tobacco use disorder 11/17/2013  . Obesity 08/24/2013  . RUQ pain 06/24/2013  . Right lumbar radiculitis 06/07/2013  . Venous stasis dermatitis 12/09/2012  . Insomnia 11/24/2012  . Major depression 11/24/2012  . Menopausal symptoms 11/24/2012  . Dysplasia of cervix 11/24/2012  . Degenerative disc disease, cervical 11/12/2012    Past Surgical History:  Procedure Laterality Date  . ABDOMINAL HYSTERECTOMY     bowel repair /w Davinci Robot procedure   . ANTERIOR CERVICAL DECOMP/DISCECTOMY FUSION N/A 01/06/2013   Procedure: ANTERIOR CERVICAL DECOMPRESSION/DISCECTOMY FUSION 2 LEVELS;  Surgeon: Sinclair Ship, MD;  Location: [facility];  Service: Orthopedics;  Laterality: N/A;  Anterior cervical decompression fusion, cervical 5-6, cervical 6-7 with instrumentation and allograft  . CESAREAN SECTION  1994   tubal ligation   . CHOLECYSTECTOMY N/A 09/06/2013   Procedure: LAPAROSCOPIC CHOLECYSTECTOMY WITH INTRAOPERATIVE CHOLANGIOGRAM;  Surgeon: Joyice Faster. Cornett, MD;  Location: [facility];  Service: General;  Laterality: N/A;  . DENTAL SURGERY  02-2013   implants placed  . OOPHORECTOMY    . TONSILLECTOMY    . UPPER GASTROINTESTINAL ENDOSCOPY      OB History    Gravida  2   Para  2   Term  Preterm      AB      Living        SAB      IAB      Ectopic      Multiple      Live Births               Home Medications    Prior to Admission medications   Medication Sig Start Date End Date Taking? Authorizing Provider  methocarbamol (ROBAXIN) 500 MG tablet Take 1 tablet (500 mg total) by mouth 2 (two) times daily. 05/02/20  Yes Noe Gens, PA-C  mupirocin ointment (BACTROBAN) 2 % Apply to wound 3 times daily for 5 days 05/02/20  Yes Gerarda Fraction, Bronwen Betters, PA-C  predniSONE (DELTASONE) 50 MG tablet Take 1 tablet (50 mg total) by mouth daily with breakfast for 5 days. 05/02/20 05/07/20 Yes Maxten Shuler O,  PA-C  cyclobenzaprine (FLEXERIL) 10 MG tablet Take 1 tablet (10 mg total) by mouth 3 (three) times daily as needed for muscle spasms. Patient not taking: No sig reported 08/23/19   Samuel Bouche, NP  diphenhydramine-acetaminophen (TYLENOL PM) 25-500 MG TABS tablet Take 1 tablet by mouth at bedtime as needed.    [provider]  gabapentin (NEURONTIN) 300 MG capsule Take 300 mg by mouth daily as needed. Patient not taking: Reported on 02/29/2020    [provider]  levothyroxine (SYNTHROID) 75 MCG tablet Take 1 tablet (75 mcg total) by mouth daily before breakfast. 03/03/20   Samuel Bouche, NP  oxybutynin (DITROPAN) 5 MG tablet Take 1 tablet (5 mg total) by mouth every 8 (eight) hours as needed for bladder spasms. 02/29/20   Samuel Bouche, NP  pantoprazole (PROTONIX) 40 MG tablet TAKE 1 TABLET BY MOUTH  TWICE DAILY BEFORE MEALS 01/10/20   Samuel Bouche, NP  sertraline (ZOLOFT) 25 MG tablet Take 1 tablet (25 mg total) by mouth daily. **PATIENT MUST KEEP UPCOMING SCHEDULED APPOINTMENT FOR ADDITIONAL REFILLS** 01/04/20   Samuel Bouche, NP    Family History Family History  Problem Relation Age of Onset  . Depression Mother   . Heart attack Father   . Hypertension Father   . Lung cancer Father   . Cervical cancer Sister   . Alcohol abuse Brother   . Diabetes Maternal Grandmother   . Colon cancer Maternal Grandmother   . Thyroid cancer Sister   . Esophageal cancer Neg Hx   . Rectal cancer Neg Hx   . Stomach cancer Neg Hx     Social History Social History   Tobacco Use  . Smoking status: Current Every Day Smoker    Packs/day: 1.00    Years: 30.00    Pack years: 30.00    Types: Cigarettes  . Smokeless tobacco: Never Used  . Tobacco comment: cut back from 2 ppd  Vaping Use  . Vaping Use: Never used  Substance Use Topics  . Alcohol use: No  . Drug use: No     Allergies   Morphine and related   Review of Systems Review of Systems  Constitutional: Negative for chills and  fever.  Musculoskeletal: Positive for arthralgias, back pain and myalgias. Negative for neck pain and neck stiffness.  Skin: Negative for color change.     Physical Exam Triage Vital Signs ED Triage Vitals  Enc Vitals Group     BP 05/02/20 1549 104/72     Pulse Rate 05/02/20 1549 77     Resp 05/02/20 1549 16  Temp 05/02/20 1549 97.9 F (36.6 C)     Temp Source 05/02/20 1549 Oral     SpO2 05/02/20 1549 99 %     Weight --      Height --      Head Circumference --      Peak Flow --      Pain Score 05/02/20 1548 6     Pain Loc --      Pain Edu? --      Excl. in [place]? --    No data found.  Updated Vital Signs BP 104/72 (BP Location: Right Arm)   Pulse 77   Temp 97.9 F (36.6 C) (Oral)   Resp 16   SpO2 99%   Visual Acuity Right Eye Distance:   Left Eye Distance:   Bilateral Distance:    Right Eye Near:   Left Eye Near:    Bilateral Near:     Physical Exam Vitals and nursing note reviewed.  Constitutional:      Appearance: Normal appearance. She is well-developed and well-nourished.  HENT:     Head: Normocephalic and atraumatic.  Eyes:     Extraocular Movements: EOM normal.  Cardiovascular:     Rate and Rhythm: Normal rate and regular rhythm.     Pulses:          Radial pulses are 2+ on the right side and 2+ on the left side.  Pulmonary:     Effort: Pulmonary effort is normal.  Musculoskeletal:        General: Tenderness present. Normal range of motion.     Cervical back: Normal range of motion and neck supple. No tenderness.     Comments: No spinal tenderness.  Muscular tenderness to Right upper back and shoulder. Full ROM shoulder without crepitus. No bony tenderness.   Skin:    General: Skin is warm and dry.     Capillary Refill: Capillary refill takes less than 2 seconds.     Findings: Abrasion present.       Neurological:     Mental Status: She is alert and oriented to person, place, and time.     Sensory: No sensory deficit.  Psychiatric:         Mood and Affect: Mood and affect normal.        Behavior: Behavior normal.      UC Treatments / Results  Labs (all labs ordered are listed, but only abnormal results are displayed) Labs Reviewed - No data to display  EKG   Radiology No results found.  Procedures Procedures (including critical care time)  Medications Ordered in UC Medications - No data to display  Initial Impression / Assessment and Plan / UC Course  I have reviewed the triage vital signs and the nursing notes.  Pertinent labs & imaging results that were available during my care of the patient were reviewed by me and considered in my medical decision making (see chart for details).    Hx and exam c/w muscular pain of shoulder and back No indication for urgent imaging at this time Rx: robaxin and prednisone  Will tx infected cyst with mupirocin  F/u PCP and sports medicine as needed  Final Clinical Impressions(s) / UC Diagnoses   Final diagnoses:  Right shoulder strain, initial encounter  Upper back pain on right side  Infected cyst of skin     Discharge Instructions      Robaxin (methocarbamol) is a muscle relaxer and may cause drowsiness. Do not  drink alcohol, drive, or operate heavy machinery while taking.  Call to schedule a follow up appointment with sports medicine next week if not improving.     ED Prescriptions    Medication Sig Dispense Auth. Provider   methocarbamol (ROBAXIN) 500 MG tablet Take 1 tablet (500 mg total) by mouth 2 (two) times daily. 20 tablet Gerarda Fraction, Evoleth Nordmeyer O, PA-C   predniSONE (DELTASONE) 50 MG tablet Take 1 tablet (50 mg total) by mouth daily with breakfast for 5 days. 5 tablet Leeroy Cha O, PA-C   mupirocin ointment (BACTROBAN) 2 % Apply to wound 3 times daily for 5 days 22 g Noe Gens, [place]     I have reviewed the PDMP during this encounter.   Noe Gens, [place] 05/03/20 1624

## 2020-05-03 ENCOUNTER — Telehealth: Payer: Self-pay

## 2020-05-03 NOTE — Telephone Encounter (Signed)
Pt calls to report continued R shoulder pain despite taking Robaxin as prescribed by E. Gerarda Fraction, [place] yesterday at [facility]. She wants to know if she can take a higher dose. Discussed w/ E. Gerarda Fraction, [place], who states pt cannot increase dose and needs to f/u with PCP. Relayed this info to patient and also encouraged her to take Tylenol as directed on bottle (unless instructed not to by MD) along w/ Robaxin for pain management. She verbalizes understanding.

## 2020-05-09 ENCOUNTER — Ambulatory Visit (INDEPENDENT_AMBULATORY_CARE_PROVIDER_SITE_OTHER): Payer: 59 | Admitting: Sports Medicine

## 2020-05-09 ENCOUNTER — Ambulatory Visit (INDEPENDENT_AMBULATORY_CARE_PROVIDER_SITE_OTHER): Payer: 59

## 2020-05-09 ENCOUNTER — Other Ambulatory Visit: Payer: Self-pay

## 2020-05-09 DIAGNOSIS — M25511 Pain in right shoulder: Secondary | ICD-10-CM

## 2020-05-09 DIAGNOSIS — M7531 Calcific tendinitis of right shoulder: Secondary | ICD-10-CM | POA: Diagnosis not present

## 2020-05-09 MED ORDER — MELOXICAM 15 MG PO TABS: ORAL_TABLET | ORAL | 3 refills | Status: DC

## 2020-05-09 NOTE — Assessment & Plan Note (Signed)
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 On exam she has impingement signs and labral signs. No severe weakness to suggest a cuff tear but more likely cuff tendinitis. Adding meloxicam, formal physical therapy, x-rays, left-handed duty for the next month, return to see me in 4 to 6 weeks, injection if no better +/- MRI. Discontinue muscle relaxer.

## 2020-05-09 NOTE — Progress Notes (Signed)
    Procedures performed today:    None.  Independent interpretation of notes and tests performed by another provider:   None.  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 On exam she has impingement signs and labral signs. No severe weakness to suggest a cuff tear but more likely cuff tendinitis. Adding meloxicam, formal physical therapy, x-rays, left-handed duty for the next month, return to see me in 4 to 6 weeks, injection if no better +/- MRI. Discontinue muscle relaxer.    ___________________________________________ Gwen Her. [name], M.D., ABFM., CAQSM. Primary Care and [place] Instructor of [place] [employer] [facility] [employer]

## 2020-05-10 ENCOUNTER — Ambulatory Visit: Payer: 59 | Admitting: Physical Therapy

## 2020-05-22 ENCOUNTER — Ambulatory Visit: Payer: 59 | Attending: Sports Medicine | Admitting: Physical Therapy

## 2020-05-22 ENCOUNTER — Encounter: Payer: Self-pay | Admitting: Physical Therapy

## 2020-05-22 ENCOUNTER — Other Ambulatory Visit: Payer: Self-pay

## 2020-05-22 DIAGNOSIS — R293 Abnormal posture: Secondary | ICD-10-CM | POA: Diagnosis present

## 2020-05-22 DIAGNOSIS — M25511 Pain in right shoulder: Secondary | ICD-10-CM | POA: Diagnosis present

## 2020-05-22 DIAGNOSIS — M542 Cervicalgia: Secondary | ICD-10-CM | POA: Insufficient documentation

## 2020-05-22 DIAGNOSIS — M6281 Muscle weakness (generalized): Secondary | ICD-10-CM | POA: Insufficient documentation

## 2020-05-22 NOTE — Therapy (Addendum)
[facility] [address]  [place], [place], [address] Phone: [phone]   Fax:  [phone]  Physical Therapy Evaluation  Patient Details  Name: Joanne [name] MRN: [id number] Date of Birth: 1970-12-21 Referring Provider (PT): Aundria Mems, MD   Encounter Date: 05/22/2020   PT End of Session - 05/22/20 1749    Visit Number 1    Number of Visits 7    Date for PT Re-Evaluation 07/03/20    Authorization Type [employer]    PT Start Time 1703    PT Stop Time 1742    PT Time Calculation (min) 39 min    Activity Tolerance Patient tolerated treatment well;Patient limited by pain    Behavior During Therapy [facility] for tasks assessed/performed           Past Medical History:  Diagnosis Date  . Anemia    h/o of Bld. Transfusion post C/Section  . Anxiety    panic attacks - several yrs. ago, pt. reports that she is claustrophobic     . Arthritis    shoulders- ? bursitis   . cervical cancer    cervical  . Cervical dysplasia   . GERD (gastroesophageal reflux disease)   . Headache(784.0)   . Hypertension   . Irregular heartbeat    pt. states that she has been told that she has an irreg. heartbeat on occas.  . Ovarian cyst   . Shortness of breath   . Thyroid disease   . Tobacco abuse     Past Surgical History:  Procedure Laterality Date  . ABDOMINAL HYSTERECTOMY     bowel repair /w [employer] Robot procedure   . ANTERIOR CERVICAL DECOMP/DISCECTOMY FUSION N/A 01/06/2013   Procedure: ANTERIOR CERVICAL DECOMPRESSION/DISCECTOMY FUSION 2 LEVELS;  Surgeon: Sinclair Ship, MD;  Location: [place];  Service: Orthopedics;  Laterality: N/A;  Anterior cervical decompression fusion, cervical 5-6, cervical 6-7 with instrumentation and allograft  . CESAREAN SECTION  1994   tubal ligation   . CHOLECYSTECTOMY N/A 09/06/2013   Procedure: LAPAROSCOPIC CHOLECYSTECTOMY WITH INTRAOPERATIVE CHOLANGIOGRAM;  Surgeon: Joyice Faster. Cornett,  MD;  Location: [place] [facility];  Service: General;  Laterality: N/A;  . DENTAL SURGERY  02-2013   implants placed  . OOPHORECTOMY    . TONSILLECTOMY    . UPPER GASTROINTESTINAL ENDOSCOPY      There were no vitals filed for this visit.    Subjective Assessment - 05/22/20 1704    Subjective Patient reports R shoulder pain for 1.5-2 months. Notes that she has started to lose sleep d/t pain. No improvement with muscle relaxants and steroids. Worse with pushing up from sitting, reaching behind the back, laying in R sidelying. Pain is located over the posterior shoulder and along the lower part of her neck. Notes radiation down the posterior arm. Does reports N/T in the R posterior arm, changes in her grip strength, and R hand cramping episodes. Pain better with meloxicam.    Pertinent History thyroid disease, SOB, HTN. HA, GERD, cervical CA, anxiety, anemia, ACDF C5-6. C6-7 2014    Limitations Lifting;House hold activities    Diagnostic tests 05/09/20 R shoulder xray: Calcific tendinitis.    Patient Stated Goals decrease pain    Currently in Pain? Yes    Pain Score 5     Pain Location Shoulder    Pain Orientation Right;Posterior    Pain Descriptors / Indicators Burning    Pain Type Acute pain    Pain Radiating  Towards posterior arm to elbow              [facility] PT Assessment - 05/22/20 1709      Assessment   Medical Diagnosis Acute pain of R shoulder    Referring Provider (PT) Aundria Mems, MD    Onset Date/Surgical Date 03/21/20    Hand Dominance Right    Next MD Visit 06/06/20    Prior Therapy yes      Balance Screen   Has the patient fallen in the past 6 months No    Has the patient had a decrease in activity level because of a fear of falling?  No    Is the patient reluctant to leave their home because of a fear of falling?  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[employer]; currently on light duty    Leisure clean the house, play with grandkids      Cognition   Overall Cognitive Status Within Functional Limits for tasks assessed      Sensation   Light Touch Appears Intact      Coordination   Gross Motor Movements are Fluid and Coordinated Yes      Posture/Postural Control   Posture/Postural Control Postural limitations    Postural Limitations Rounded Shoulders;Forward head      ROM / Strength   AROM / PROM / Strength AROM;Strength      AROM   AROM Assessment Site Shoulder;Cervical    Right/Left Shoulder Right;Left    Right Shoulder Flexion 112 Degrees    Right Shoulder ABduction 95 Degrees    Right Shoulder Internal Rotation --   FIR L1; c/o cavitation   Right Shoulder External Rotation --   FER to top of head   Left Shoulder Flexion 145 Degrees    Left Shoulder ABduction 165 Degrees    Left Shoulder Internal Rotation --   FIR T7   Left Shoulder External Rotation --   FER T2   Cervical Flexion 15    Cervical Extension 20   reproduction of pain in the R posterior shoulder   Cervical - Right Side Bend 19    Cervical - Left Side Bend 17    Cervical - Right Rotation 40   "crunching"   Cervical - Left Rotation 40   "crunching"     Strength   Strength Assessment Site Shoulder;Elbow;Wrist;Hand    Right/Left Shoulder Right;Left    Right Shoulder Flexion 4+/5    Right Shoulder ABduction 4+/5    Right Shoulder Internal Rotation 4+/5    Right Shoulder External Rotation 4/5   pain   Left Shoulder Flexion 4+/5    Left Shoulder ABduction 4+/5    Left Shoulder Internal Rotation 4+/5    Left Shoulder External Rotation 4+/5    Right/Left Elbow Right;Left    Right Elbow Flexion 4/5    Right Elbow Extension 4+/5    Left Elbow Flexion 4+/5    Left Elbow Extension 4+/5    Right/Left  Wrist Right;Left    Right Wrist Flexion 4/5    Right Wrist Extension 4/5    Left Wrist Flexion 4+/5    Left Wrist  Extension 4/5    Right/Left hand Right;Left    Right Hand Grip (lbs) 26.67   30, 25, 25   Left Hand Grip (lbs) 30   30, 30, 30     Palpation   Palpation comment TTP diffusely over R shoulder with marked tenderness at R teres minor and infraspinatus                      Objective measurements completed on examination: See above findings.               PT Education - 05/22/20 1748    Education Details prognosis, POC, HEP; administered yellow putty for grip strengthening 44mn at a time    Person(s) Educated Patient    Methods Explanation;Demonstration;Tactile cues;Verbal cues;Handout    Comprehension Returned demonstration;Verbalized understanding            PT Short Term Goals - 05/22/20 1755      PT SHORT TERM GOAL #1   Title Patient to be independent with initial HEP.    Time 3    Period Weeks    Status New    Target Date 06/12/20             PT Long Term Goals - 05/22/20 1756      PT LONG TERM GOAL #1   Title Patient to be independent with advanced HEP.    Time 6    Period Weeks    Status New    Target Date 07/03/20      PT LONG TERM GOAL #2   Title Patient to demonstrate R shoulder AROM WFL and without pain limiting.    Time 6    Period Weeks    Status New    Target Date 07/03/20      PT LONG TERM GOAL #3   Title Patient to demonstrate cervical AROM WFL and without pain limiting.    Time 6    Period Weeks    Status New    Target Date 07/03/20      PT LONG TERM GOAL #4   Title Patient to demonstrate R UE strength >/=4+/5 and R grip strength >/=30 lbs.    Time 6    Period Weeks    Status New    Target Date 07/03/20      PT LONG TERM GOAL #5   Title Patient to return to full work duties without pain limiting.    Time 6    Period Weeks    Status New    Target Date 07/03/20                  Plan -  05/22/20 1750    Clinical Impression Statement Patient is a 50y/o F presenting to OPPT with c/o acute R shoulder and neck pain for the past 1.5-2 months without acute injury. Pain is located over the posterior shoulder and along the lower cervical spine. Notes radiation of pain and N/T down the posterior arm. Pain worse when transferring from sit to stand, reaching behind the back, and R sidelying. Patient today presenting with rounded shoulders and forward head posture, limited and painful R shoulder and cervical AROM, decreased R UE and grip strength, and diffuse TTP over R shoulder with marked tenderness at R teres minor and infraspinatus. Patient was educated on gentle AAROM and cervical ROM HEP- patient reported understanding. Would benefit from skilled PT services 1x/week for 6 weeks to address aforementioned impairments.  Personal Factors and Comorbidities Age;Comorbidity 3+;Past/Current Experience;Profession;Time since onset of injury/illness/exacerbation    Comorbidities thyroid disease, SOB, HTN. HA, GERD, cervical CA, anxiety, anemia, ACDF C5-6. C6-7 2014    Examination-Activity Limitations Bed Mobility;Sleep;Bathing;Carry;Dressing;Transfers;Hygiene/Grooming;Lift;Reach Overhead;Self Feeding    Examination-Participation Restrictions Cleaning;Shop;Community Activity;Driving;Yard Work;Laundry;Meal Prep;Occupation    Stability/Clinical Decision Making Stable/Uncomplicated    Clinical Decision Making Low    Rehab Potential Good    PT Frequency 1x / week    PT Duration 6 weeks    PT Treatment/Interventions ADLs/Self Care Home Management;Cryotherapy;Electrical Stimulation;Iontophoresis 35m/ml Dexamethasone;Moist Heat;Traction;Therapeutic exercise;Therapeutic activities;Functional mobility training;Ultrasound;Neuromuscular re-education;Patient/family education;Manual techniques;Vasopneumatic Device;Taping;Energy conservation;Dry needling;Passive range of motion    PT Next Visit Plan reassess HEP;  shoulder FOTO; progress R shoulder and cervical AROM    Consulted and Agree with Plan of Care Patient           Patient will benefit from skilled therapeutic intervention in order to improve the following deficits and impairments:  Hypomobility,Increased edema,Pain,Impaired UE functional use,Increased fascial restricitons,Decreased strength,Decreased activity tolerance,Increased muscle spasms,Improper body mechanics,Decreased range of motion,Impaired flexibility,Postural dysfunction  Visit Diagnosis: Acute pain of right shoulder  Cervicalgia  Muscle weakness (generalized)  Abnormal posture     Problem List Patient Active Problem List   Diagnosis Date Noted  . Right shoulder pain 05/09/2020  . Skin tag 05/10/2019  . Depression with anxiety 05/10/2019  . Sleep disturbance 05/10/2019  . Pain of right sacroiliac joint 05/20/2018  . Right knee pain 05/20/2018  . Family history of lung cancer 04/02/2018  . Subclinical hypothyroidism 06/24/2017  . History of cervical dysplasia 06/24/2017  . History of hysterectomy for indication other than cancer 06/24/2017  . Arthralgia 06/24/2017  . Polyphagia 06/24/2017  . Barrett's esophagus determined by endoscopy 06/23/2017  . Esophagitis, erosive 12/19/2014  . Mass of left axilla 04/03/2014  . Excessive daytime sleepiness 03/14/2014  . Abdominal pain, epigastric 03/14/2014  . Annual physical exam 01/17/2014  . Tobacco use disorder 11/17/2013  . Obesity 08/24/2013  . RUQ pain 06/24/2013  . Right lumbar radiculitis 06/07/2013  . Venous stasis dermatitis 12/09/2012  . Insomnia 11/24/2012  . Major depression 11/24/2012  . Menopausal symptoms 11/24/2012  . Dysplasia of cervix 11/24/2012  . Degenerative disc disease, cervical 11/12/2012     YJanene Harvey PT, DPT 05/22/20 5:58 PM   [facility] [address] [place] [address]Phone: [phone]   Fax:  [phone] Name: Joanne MERTAMRN: [id number] of Birth: [birth year]  PHYSICAL THERAPY DISCHARGE SUMMARY  Visits from Start of Care: 1  Current functional level related to goals / functional outcomes: See above clinical impression; patient did not return, stating that she no longer needed PT   Remaining deficits: See above   Education / Equipment: HEP  Plan: Patient agrees to discharge.  Patient goals were not met. Patient is being discharged due to being pleased with the current functional level.  ?????     YJanene Harvey PT, DPT 07/25/20 1:35 PM

## 2020-05-30 ENCOUNTER — Ambulatory Visit: Payer: 59

## 2020-06-05 ENCOUNTER — Ambulatory Visit: Payer: 59 | Admitting: Physical Therapy

## 2020-06-06 ENCOUNTER — Other Ambulatory Visit: Payer: Self-pay

## 2020-06-06 ENCOUNTER — Ambulatory Visit (INDEPENDENT_AMBULATORY_CARE_PROVIDER_SITE_OTHER): Payer: 59 | Admitting: Sports Medicine

## 2020-06-06 ENCOUNTER — Ambulatory Visit (INDEPENDENT_AMBULATORY_CARE_PROVIDER_SITE_OTHER): Payer: 59

## 2020-06-06 DIAGNOSIS — M5030 Other cervical disc degeneration, unspecified cervical region: Secondary | ICD-10-CM | POA: Diagnosis not present

## 2020-06-06 MED ORDER — PREGABALIN 25 MG PO CAPS: 25.0000 mg | ORAL_CAPSULE | Freq: Two times a day (BID) | ORAL | 2 refills | Status: DC

## 2020-06-06 NOTE — Assessment & Plan Note (Signed)
This is a pleasant 50 year old female, she has had a C5-C7 fusion with Dr. 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 Unfortunately she is having increasing burning sensations in her paracervical muscles. Nothing radicular. There may be some adjacent level disease, adding flexion/extension view cervical spine x-rays to evaluate her fusion construct, we will also do Lyrica as she got excessive sedation with gabapentin. Home rehab exercises given, return to see me in 6 weeks, we will consider MRI for interventional planning if no better.

## 2020-06-06 NOTE — Progress Notes (Signed)
    Procedures performed today:    None.  Independent interpretation of notes and tests performed by another provider:   None.  Brief History, Exam, Impression, and Recommendations:    Degenerative disc disease, cervical This is a pleasant 50 year old female, she has had a C5-C7 fusion with Dr. 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 Unfortunately she is having increasing burning sensations in [name] paracervical muscles. Nothing radicular. There may be some adjacent level disease, adding flexion/extension view cervical spine x-rays to evaluate [name] fusion construct, we will also do Lyrica as she got excessive sedation with gabapentin. Home rehab exercises given, return to see me in 6 weeks, we will consider MRI for interventional planning if no better.    ___________________________________________ Joanne [name]. [name], M.D., ABFM., CAQSM. Primary Care and [place] Instructor of [place] [facility] of Medicine

## 2020-06-19 ENCOUNTER — Encounter: Payer: 59 | Admitting: Physical Therapy

## 2020-07-03 ENCOUNTER — Encounter: Payer: 59 | Admitting: Physical Therapy

## 2020-07-05 ENCOUNTER — Other Ambulatory Visit: Payer: Self-pay | Admitting: Gastroenterology

## 2020-07-05 DIAGNOSIS — K2270 Barrett's esophagus without dysplasia: Secondary | ICD-10-CM

## 2020-07-18 ENCOUNTER — Other Ambulatory Visit: Payer: Self-pay

## 2020-07-18 ENCOUNTER — Ambulatory Visit (INDEPENDENT_AMBULATORY_CARE_PROVIDER_SITE_OTHER): Payer: 59 | Admitting: Sports Medicine

## 2020-07-18 DIAGNOSIS — M5030 Other cervical disc degeneration, unspecified cervical region: Secondary | ICD-10-CM

## 2020-07-18 DIAGNOSIS — M2550 Pain in unspecified joint: Secondary | ICD-10-CM

## 2020-07-18 MED ORDER — PREGABALIN 50 MG PO CAPS: ORAL_CAPSULE | ORAL | 2 refills | Status: DC

## 2020-07-18 NOTE — Addendum Note (Signed)
Addended by: Silverio Decamp on: 07/18/2020 10:37 AM   Modules accepted: Orders

## 2020-07-18 NOTE — Assessment & Plan Note (Signed)
Joanne [name] returns, she is a pleasant 50 year old female, historically she had a C5-C7 fusion with Dr. 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 She did have some increasing burning sensations in the paracervical muscles, nothing radicular, there was likely some adjacent level disease and we added Lyrica, she had excessive sedation with gabapentin. Lyrica at 25 twice daily 3 times daily is ineffective, increasing to 50 mg twice daily to 3 times daily per Return to see me in a month and a video visit. She has been gaining some weight and this is causing increasing low back pain, she will continue to work hard with weight loss clinic.

## 2020-07-18 NOTE — Assessment & Plan Note (Signed)
Widespread hip aches and pains, morning stiffness, adding a rheumatoid work-up looking for rheumatoid arthritis and serum evidence of polymyalgia rheumatica.

## 2020-07-18 NOTE — Progress Notes (Addendum)
    Procedures performed today:    None.  Independent interpretation of notes and tests performed by another provider:   None.  Brief History, Exam, Impression, and Recommendations:    Degenerative disc disease, cervical Jayln returns, she is a pleasant 50 year old female, historically she had a C5-C7 fusion with Dr. 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 She did have some increasing burning sensations in the paracervical muscles, nothing radicular, there was likely some adjacent level disease and we added Lyrica, she had excessive sedation with gabapentin. Lyrica at 25 twice daily 3 times daily is ineffective, increasing to 50 mg twice daily to 3 times daily per Return to see me in a month and a video visit. She has been gaining some weight and this is causing increasing low back pain, she will continue to work hard with weight loss clinic.  Polyarthralgia Widespread hip aches and pains, morning stiffness, adding a rheumatoid work-up looking for rheumatoid arthritis and serum evidence of polymyalgia rheumatica.    ___________________________________________ Gwen Her. [name], M.D., ABFM., CAQSM. Primary Care and [place] Instructor of [place] of [facility] of Medicine

## 2020-08-15 ENCOUNTER — Telehealth: Payer: 59 | Admitting: Sports Medicine

## 2020-09-10 ENCOUNTER — Telehealth: Payer: Self-pay | Admitting: General Practice

## 2020-09-10 NOTE — Telephone Encounter (Signed)
Transition Care Management Follow-up Telephone Call Date of discharge and from where: 09/09/20 [facility] How have you been since you were released from the hospital? Fever is better; still has hip pain. Any questions or concerns? No  Items Reviewed: Did the pt receive and understand the discharge instructions provided? No  Medications obtained and verified? No  Other? No  Any new allergies since your discharge? No  Dietary orders reviewed? No Do you have support at home? Yes   Home Care and Equipment/Supplies: Were home health services ordered? no   Functional Questionnaire: (I = Independent and D = Dependent) ADLs: I  Bathing/Dressing- I  Meal Prep- I  Eating- I  Maintaining continence- I  Transferring/Ambulation- I  Managing Meds- I  Follow up appointments reviewed:  PCP Hospital f/u appt confirmed? Yes  Scheduled to see Samuel Bouche, NP on 09/12/20 @ 1110. [facility] f/u appt confirmed? No   Are transportation arrangements needed? No  If their condition worsens, is the pt aware to call PCP or go to the Emergency Dept.? Yes Was the patient provided with contact information for the PCP's office or ED? Yes Was to pt encouraged to call back with questions or concerns? Yes

## 2020-09-12 ENCOUNTER — Encounter: Payer: Self-pay | Admitting: Medical-Surgical

## 2020-09-12 ENCOUNTER — Telehealth (INDEPENDENT_AMBULATORY_CARE_PROVIDER_SITE_OTHER): Payer: 59 | Admitting: Medical-Surgical

## 2020-09-12 ENCOUNTER — Other Ambulatory Visit: Payer: Self-pay

## 2020-09-12 DIAGNOSIS — U071 COVID-19: Secondary | ICD-10-CM

## 2020-09-12 DIAGNOSIS — Z09 Encounter for follow-up examination after completed treatment for conditions other than malignant neoplasm: Secondary | ICD-10-CM

## 2020-09-12 NOTE — Progress Notes (Signed)
Virtual Visit via Video Note  I connected with Joanne [name] on 09/12/20 at 11:10 AM EDT by a video enabled telemedicine application and verified that I am speaking with the correct person using two identifiers.   I discussed the limitations of evaluation and management by telemedicine and the availability of in person appointments. The patient expressed understanding and agreed to proceed.  Patient location: home Provider locations: office  Subjective:    CC: Hospital discharge follow-up  HPI: Pleasant 50 year old female presenting via [place] video visit today for hospital follow-up.  She was seen at the emergency room on 6/5 where she was diagnosed as positive for COVID-19 infection.  At discharge from the emergency room, she was provided with budesonide to use in a nebulizer.  She has been using this as prescribed, tolerating well without side effects.  Currently she reports her most troubling symptoms are a deep cough, posttussive nausea, bilateral hip pain, and mouth sores.  Also has alterations in taste and a poor appetite.  She is able to eat and drink okay but the mouth sores and associated pain is a limiting factor.  Denies fever, chills, shortness of breath, chest pain, and GI symptoms.  Past medical history, Surgical history, Family history not pertinant except as noted below, Social history, Allergies, and medications have been entered into the medical record, reviewed, and corrections made.   Review of Systems: See HPI for pertinent positives and negatives.   Objective:    General: Speaking clearly in complete sentences without any shortness of breath.  Alert and oriented x3.  Normal judgment. No apparent acute distress.  Impression and Recommendations:    1. Hospital discharge follow-up 2. COVID-19 Continue symptomatic treatment.  Sending in a prescription for Magic mouthwash to help with the mouth sores/pain.  Continue using budesonide nebulizers as prescribed.  Due to  continued symptoms, do not return to work at this point.  A letter has been sent through MyChart to allow her to return to work on 6/13.  Reviewed emergency symptoms that should prompt ER/urgent care evaluation.  Patient verbalized understanding and is agreeable to the plan.  I discussed the assessment and treatment plan with the patient. The patient was provided an opportunity to ask questions and all were answered. The patient agreed with the plan and demonstrated an understanding of the instructions.   The patient was advised to call back or seek an in-person evaluation if the symptoms worsen or if the condition fails to improve as anticipated.  20 minutes of non-face-to-face time was provided during this encounter.  Return if symptoms worsen or fail to improve.  Clearnce Sorrel, DNP, APRN, FNP-BC [facility] [facility]

## 2020-09-24 ENCOUNTER — Encounter: Payer: Self-pay | Admitting: Medical-Surgical

## 2020-10-10 DIAGNOSIS — R197 Diarrhea, unspecified: Secondary | ICD-10-CM | POA: Insufficient documentation

## 2020-10-10 DIAGNOSIS — K219 Gastro-esophageal reflux disease without esophagitis: Secondary | ICD-10-CM | POA: Insufficient documentation

## 2020-12-05 IMAGING — DX DG CHEST 2V
2 series · 2 of 2 positions shown · non-contrast
Comparison: 06/24/2013

CLINICAL DATA: Abnormal weight loss over past 2 months. Left-sided
chest pain and intermittent shortness of breath. Smoker.

EXAM:
CHEST - 2 VIEW

[chest pa]
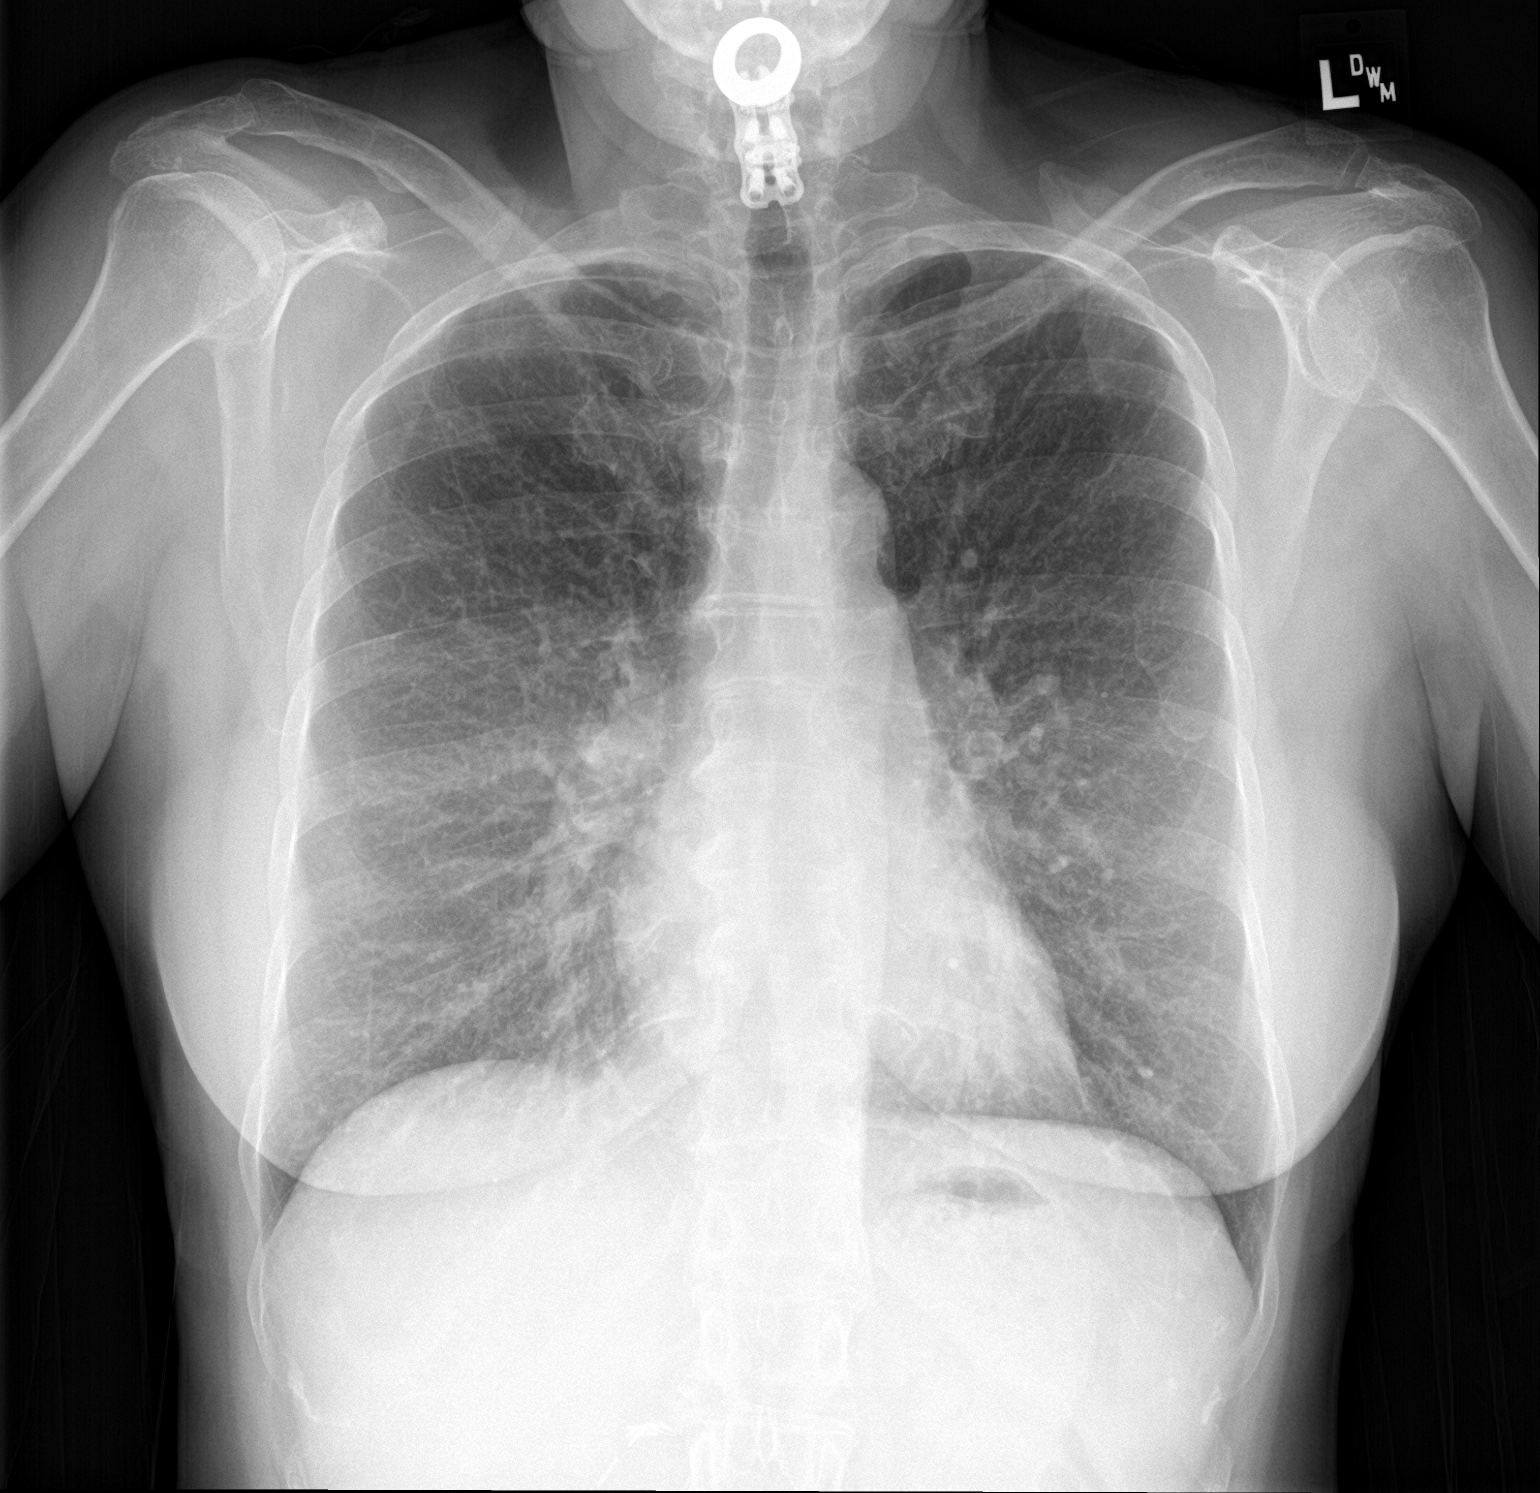

[chest lat]
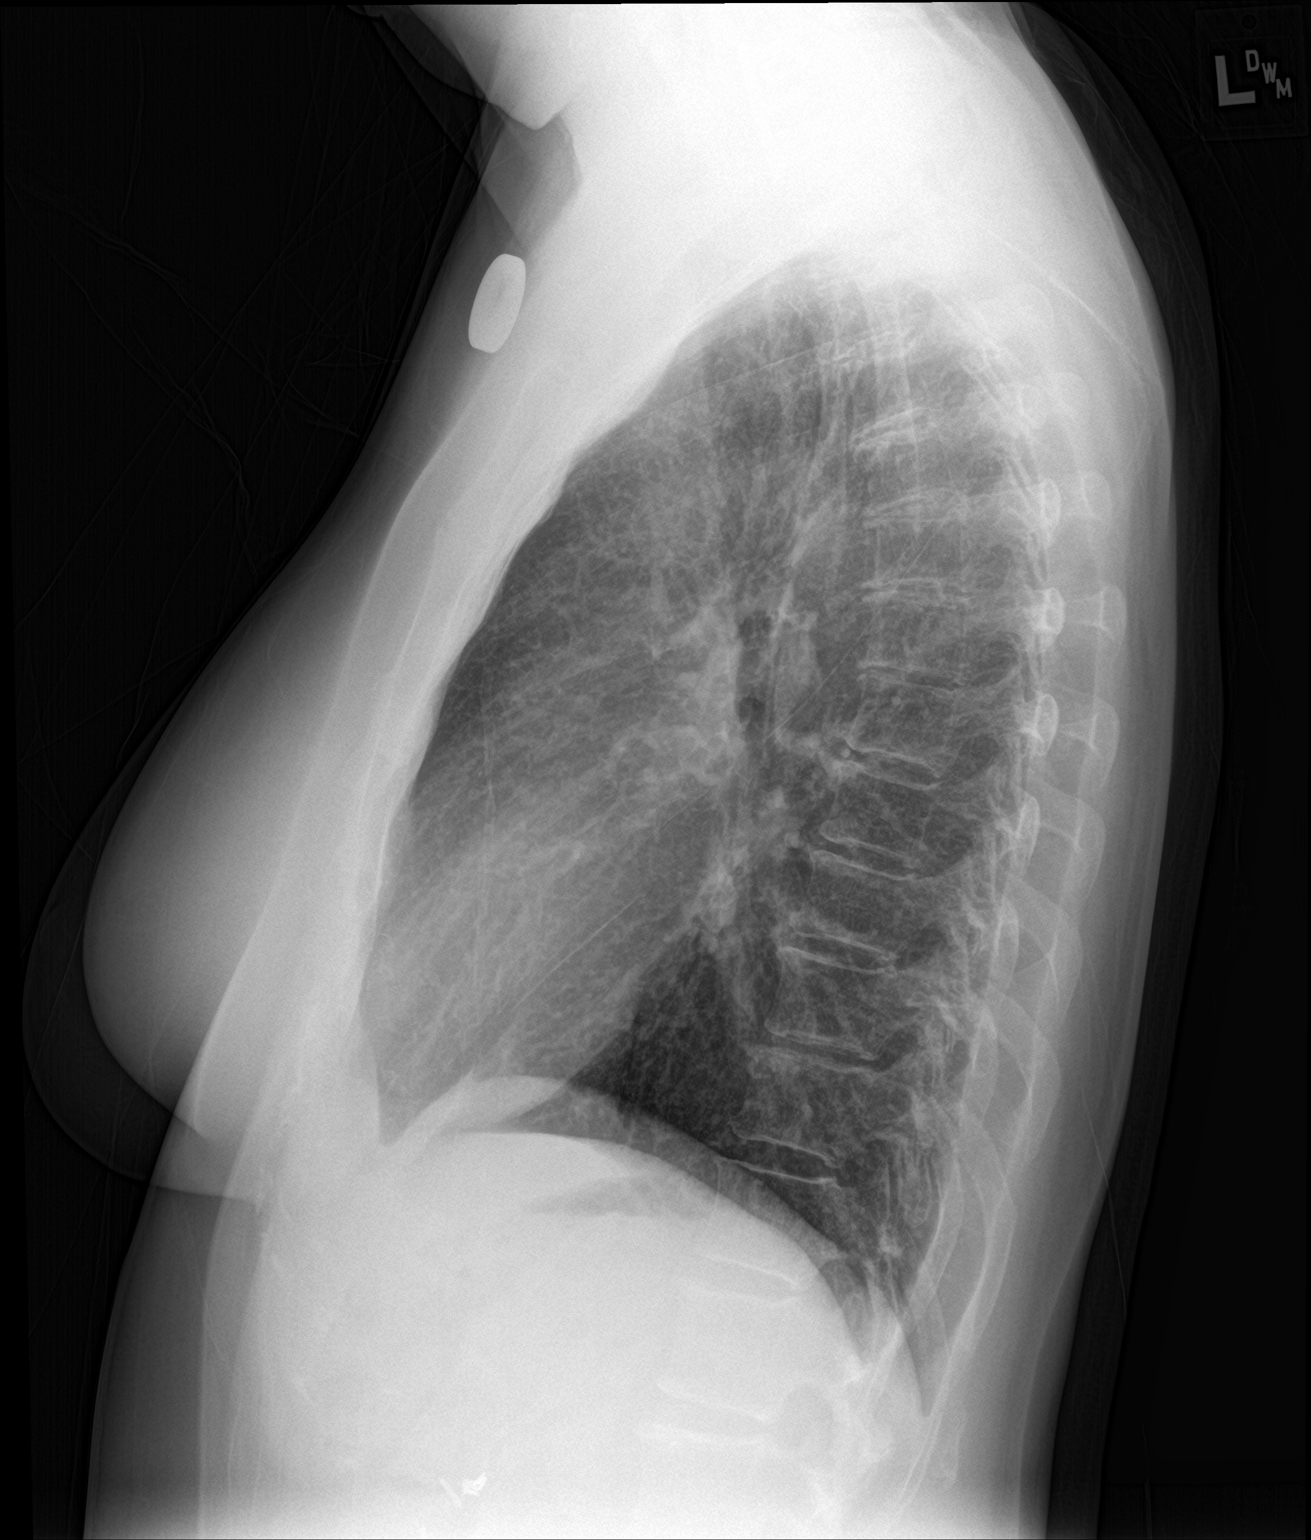

[2 of 2 positions shown; findings below may reference images not displayed]

FINDINGS: The heart size and mediastinal contours are within normal limits.
Chronic pulmonary interstitial prominence noted. No evidence of
pulmonary infiltrate or edema. No evidence of pleural effusion. Mild
thoracic spine degenerative disc disease noted.
IMPRESSION: No active cardiopulmonary disease.

## 2021-07-31 ENCOUNTER — Other Ambulatory Visit: Payer: Self-pay | Admitting: Gastroenterology

## 2021-07-31 DIAGNOSIS — K2270 Barrett's esophagus without dysplasia: Secondary | ICD-10-CM

## 2021-10-03 ENCOUNTER — Ambulatory Visit (INDEPENDENT_AMBULATORY_CARE_PROVIDER_SITE_OTHER): Payer: 59

## 2021-10-03 ENCOUNTER — Ambulatory Visit (INDEPENDENT_AMBULATORY_CARE_PROVIDER_SITE_OTHER): Payer: 59 | Admitting: Sports Medicine

## 2021-10-03 DIAGNOSIS — M25511 Pain in right shoulder: Secondary | ICD-10-CM | POA: Diagnosis not present

## 2021-10-03 DIAGNOSIS — R21 Rash and other nonspecific skin eruption: Secondary | ICD-10-CM

## 2021-10-03 DIAGNOSIS — M2550 Pain in unspecified joint: Secondary | ICD-10-CM

## 2021-10-03 MED ORDER — TRIAMCINOLONE ACETONIDE 0.5 % EX CREA: 1.0000 | TOPICAL_CREAM | Freq: Two times a day (BID) | CUTANEOUS | 3 refills | Status: DC

## 2021-10-03 NOTE — Assessment & Plan Note (Signed)
Widespread aches and pains, hips, hip girdle, shoulders, question polymyalgia rheumatica, will do a full rheumatoid work-up.

## 2021-10-03 NOTE — Assessment & Plan Note (Signed)
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 X-rays did show calcific tendinopathy. She has done well with her home conditioning, she will continue this and we did a subacromial injection today, return in 6 weeks.

## 2021-10-03 NOTE — Assessment & Plan Note (Signed)
2 months of multiple 1 cm reddish macules on the lower legs, nowhere else on the body, profoundly pruritic. Adding topical triamcinolone high potency. We will do this for 2 to 4 weeks followed by a biopsy if not sufficiently better.

## 2021-10-03 NOTE — Progress Notes (Signed)
    Procedures performed today:    Procedure: Real-time Ultrasound Guided injection of the right subacromial bursa Device: Samsung HS60  Verbal informed consent obtained.  Time-out conducted.  Noted no overlying erythema, induration, or other signs of local infection.  Skin prepped in a sterile fashion.  Local anesthesia: Topical Ethyl chloride.  With sterile technique and under real time ultrasound guidance: Noted calcific tendinopathy, 1 cc Kenalog 40, 1 cc lidocaine, 1 cc bupivacaine injected easily Completed without difficulty  Advised to call if fevers/chills, erythema, induration, drainage, or persistent bleeding.  Images permanently stored and available for review in PACS.  Impression: Technically successful ultrasound guided injection.  Independent interpretation of notes and tests performed by another provider:   None.  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 X-rays did show calcific tendinopathy. She has done well with her home conditioning, she will continue this and we did a subacromial injection today, return in 6 weeks.  Skin rash of both lower legs 2 months of multiple 1 cm reddish macules on the lower legs, nowhere else on the body, profoundly pruritic. Adding topical triamcinolone high potency. We will do this for 2 to 4 weeks followed by a biopsy if not sufficiently better.  Polyarthralgia Widespread aches and pains, hips, hip girdle, shoulders, question polymyalgia rheumatica, will do a full rheumatoid work-up.    ____________________________________________ Gwen Her. [name], M.D., ABFM., CAQSM., AME. Primary Care and Sports Medicine [facility]  Adjunct Professor of [place] [employer] [facility] of Medicine  Engineer, structural

## 2021-10-08 LAB — CBC WITH DIFFERENTIAL/PLATELET
Absolute Monocytes: 271 cells/uL (ref 200–950)
Basophils Absolute: 29 cells/uL (ref 0–200)
Basophils Relative: 0.7 %
Eosinophils Absolute: 70 cells/uL (ref 15–500)
Eosinophils Relative: 1.7 %
HCT: 46 % — ABNORMAL HIGH (ref 35.0–45.0)
Hemoglobin: 15.6 g/dL — ABNORMAL HIGH (ref 11.7–15.5)
Lymphs Abs: 1156 cells/uL (ref 850–3900)
MCH: 31.6 pg (ref 27.0–33.0)
MCHC: 33.9 g/dL (ref 32.0–36.0)
MCV: 93.1 fL (ref 80.0–100.0)
MPV: 11.6 fL (ref 7.5–12.5)
Monocytes Relative: 6.6 %
Neutro Abs: 2575 cells/uL (ref 1500–7800)
Neutrophils Relative %: 62.8 %
Platelets: 180 10*3/uL (ref 140–400)
RBC: 4.94 10*6/uL (ref 3.80–5.10)
RDW: 12.3 % (ref 11.0–15.0)
Total Lymphocyte: 28.2 %
WBC: 4.1 10*3/uL (ref 3.8–10.8)

## 2021-10-08 LAB — COMPREHENSIVE METABOLIC PANEL
AG Ratio: 1.5 (calc) (ref 1.0–2.5)
ALT: 9 U/L (ref 6–29)
AST: 13 U/L (ref 10–35)
Albumin: 4.5 g/dL (ref 3.6–5.1)
Alkaline phosphatase (APISO): 71 U/L (ref 37–153)
BUN: 7 mg/dL (ref 7–25)
CO2: 28 mmol/L (ref 20–32)
Calcium: 9.1 mg/dL (ref 8.6–10.4)
Chloride: 105 mmol/L (ref 98–110)
Creat: 0.56 mg/dL (ref 0.50–1.03)
Globulin: 3 g/dL (calc) (ref 1.9–3.7)
Glucose, Bld: 93 mg/dL (ref 65–99)
Potassium: 4.7 mmol/L (ref 3.5–5.3)
Sodium: 139 mmol/L (ref 135–146)
Total Bilirubin: 0.5 mg/dL (ref 0.2–1.2)
Total Protein: 7.5 g/dL (ref 6.1–8.1)

## 2021-10-08 LAB — RHEUMATOID FACTOR (IGA, IGG, IGM)
Rheumatoid Factor (IgA): <5 U (ref <=6)
Rheumatoid Factor (IgG): <5 U (ref <=6)
Rheumatoid Factor (IgM): 14 U — ABNORMAL HIGH (ref <=6)

## 2021-10-08 LAB — LUPUS(12) PANEL
Anti Nuclear Antibody (ANA): POSITIVE — AB
C3 Complement: 150 mg/dL (ref 83–193)
C4 Complement: 26 mg/dL (ref 15–57)
ENA SM Ab Ser-aCnc: <1 AI
Rheumatoid fact SerPl-aCnc: <14 IU/mL (ref <14)
Ribosomal P Protein Ab: <1 AI
SM/RNP: <1 AI
SSA (Ro) (ENA) Antibody, IgG: <1 AI
SSB (La) (ENA) Antibody, IgG: <1 AI
Scleroderma (Scl-70) (ENA) Antibody, IgG: <1 AI
Thyroperoxidase Ab SerPl-aCnc: 453 IU/mL — ABNORMAL HIGH (ref <9)
ds DNA Ab: 1 IU/mL

## 2021-10-08 LAB — CK: Total CK: 40 U/L (ref 29–143)

## 2021-10-08 LAB — ANTI-NUCLEAR AB-TITER (ANA TITER): ANA Titer 1: 1:320 {titer} — ABNORMAL HIGH

## 2021-10-08 LAB — CYCLIC CITRUL PEPTIDE ANTIBODY, IGG: Cyclic Citrullin Peptide Ab: <16 UNITS

## 2021-10-08 LAB — HLA-B27 ANTIGEN: HLA-B27 Antigen: NEGATIVE

## 2021-10-08 LAB — SEDIMENTATION RATE: Sed Rate: 9 mm/h (ref 0–30)

## 2021-10-08 LAB — URIC ACID: Uric Acid, Serum: 3.1 mg/dL (ref 2.5–7.0)

## 2021-11-05 NOTE — Progress Notes (Unsigned)
   Established Patient Office Visit  Subjective   Patient ID: Joanne [name], female   DOB: 1970/11/19 Age: 51 y.o. MRN: [id number]   No chief complaint on file.   HPI Pleasant 51 year old female presenting today to discuss sleep concerns.  ROS    Objective:    There were no vitals filed for this visit.  Physical Exam   No results found for this or any previous visit (from the past 24 hour(s)).   {Labs (Optional):23779}  The ASCVD Risk score (Arnett DK, et al., 2019) failed to calculate for the following reasons:   The systolic blood pressure is missing   Assessment & Plan:   No problem-specific Assessment & Plan notes found for this encounter.   No follow-ups on file.  ___________________________________________ Clearnce Sorrel, DNP, APRN, FNP-BC Primary Care and [place]

## 2021-11-07 ENCOUNTER — Encounter: Payer: Self-pay | Admitting: Medical-Surgical

## 2021-11-07 ENCOUNTER — Ambulatory Visit (INDEPENDENT_AMBULATORY_CARE_PROVIDER_SITE_OTHER): Payer: 59 | Admitting: Medical-Surgical

## 2021-11-07 VITALS — BP 111/72 | HR 77 | Resp 20 | Ht 65.0 in | Wt 152.5 lb

## 2021-11-07 DIAGNOSIS — G479 Sleep disorder, unspecified: Secondary | ICD-10-CM | POA: Diagnosis not present

## 2021-11-07 DIAGNOSIS — L989 Disorder of the skin and subcutaneous tissue, unspecified: Secondary | ICD-10-CM | POA: Diagnosis not present

## 2021-11-07 DIAGNOSIS — R21 Rash and other nonspecific skin eruption: Secondary | ICD-10-CM | POA: Diagnosis not present

## 2021-11-07 MED ORDER — DOXEPIN HCL 6 MG PO TABS: 6.0000 mg | ORAL_TABLET | Freq: Every evening | ORAL | 0 refills | Status: DC

## 2021-11-07 MED ORDER — TRIAMCINOLONE ACETONIDE 0.5 % EX CREA
1.0000 | TOPICAL_CREAM | Freq: Two times a day (BID) | CUTANEOUS | 3 refills | Status: DC
Start: 2021-11-07 — End: 2022-01-28

## 2021-11-14 ENCOUNTER — Ambulatory Visit: Payer: 59 | Admitting: Sports Medicine

## 2021-11-26 ENCOUNTER — Ambulatory Visit (INDEPENDENT_AMBULATORY_CARE_PROVIDER_SITE_OTHER): Payer: 59 | Admitting: Sports Medicine

## 2021-11-26 ENCOUNTER — Ambulatory Visit (INDEPENDENT_AMBULATORY_CARE_PROVIDER_SITE_OTHER): Payer: 59

## 2021-11-26 DIAGNOSIS — M25511 Pain in right shoulder: Secondary | ICD-10-CM | POA: Diagnosis not present

## 2021-11-26 NOTE — Progress Notes (Signed)
    Procedures performed today:    Procedure: Real-time Ultrasound Guided injection of the right subacromial bursa Device: Samsung HS60  Verbal informed consent obtained.  Time-out conducted.  Noted no overlying erythema, induration, or other signs of local infection.  Skin prepped in a sterile fashion.  Local anesthesia: Topical Ethyl chloride.  With sterile technique and under real time ultrasound guidance: Noted intact rotator cuff, 1 cc Kenalog 40, 1 cc lidocaine, 1 cc bupivacaine injected easily Completed without difficulty  Advised to call if fevers/chills, erythema, induration, drainage, or persistent bleeding.  Images permanently stored and available for review in PACS.  Impression: Technically successful ultrasound guided injection.  Independent interpretation of notes and tests performed by another provider:   None.  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 Griffin Basil.    ____________________________________________ Joanne Her. Joanne [name], M.D., ABFM., CAQSM., AME. Primary Care and Sports Medicine [facility]  Adjunct Professor of [place] [employer]  Risk manager

## 2021-11-26 NOTE — Assessment & Plan Note (Signed)
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 Griffin Basil.

## 2021-12-04 NOTE — Progress Notes (Unsigned)
   Established Patient Office Visit  Subjective   Patient ID: Joanne [name], female   DOB: 10-Dec-1970 Age: 51 y.o. MRN: [id number]   No chief complaint on file.   HPI Pleasant 51 year old female presenting today for follow up on insomnia. Approximately 4 weeks ago, started Doxepin '6mg'$  nightly.    Objective:    There were no vitals filed for this visit.  Physical Exam   No results found for this or any previous visit (from the past 24 hour(s)).   {Labs (Optional):23779}  The 10-year ASCVD risk score (Arnett DK, et al., 2019) is: 2.6%   Values used to calculate the score:     Age: 72 years     Sex: Female     Is Non-Hispanic African American: No     Diabetic: No     Tobacco smoker: Yes     Systolic Blood Pressure: 381 mmHg     Is BP treated: No     HDL Cholesterol: 52 mg/dL     Total Cholesterol: 178 mg/dL   Assessment & Plan:   No problem-specific Assessment & Plan notes found for this encounter.   No follow-ups on file.  ___________________________________________ Clearnce Sorrel, DNP, APRN, FNP-BC Primary Care and [place]

## 2021-12-05 ENCOUNTER — Encounter: Payer: Self-pay | Admitting: Medical-Surgical

## 2021-12-05 ENCOUNTER — Ambulatory Visit (INDEPENDENT_AMBULATORY_CARE_PROVIDER_SITE_OTHER): Payer: 59 | Admitting: Medical-Surgical

## 2021-12-05 VITALS — BP 119/77 | HR 67 | Resp 20 | Ht 65.0 in | Wt 148.6 lb

## 2021-12-05 DIAGNOSIS — G4700 Insomnia, unspecified: Secondary | ICD-10-CM

## 2021-12-05 DIAGNOSIS — Z801 Family history of malignant neoplasm of trachea, bronchus and lung: Secondary | ICD-10-CM | POA: Diagnosis not present

## 2021-12-05 MED ORDER — TRAZODONE HCL 100 MG PO TABS: 50.0000 mg | ORAL_TABLET | Freq: Every day | ORAL | 1 refills | Status: DC

## 2021-12-22 ENCOUNTER — Other Ambulatory Visit: Payer: Self-pay | Admitting: Medical-Surgical

## 2021-12-22 DIAGNOSIS — G479 Sleep disorder, unspecified: Secondary | ICD-10-CM

## 2021-12-23 ENCOUNTER — Telehealth: Payer: Self-pay

## 2021-12-23 NOTE — Telephone Encounter (Signed)
Insurance denied approval for imaging order. Appeal and/or peer to peer required by provider. Can call [phone]. Case/Ref #[id number].   Per insurance, patient has not completed 6 weeks of provider directed treatment, the provider directed treatment did not occur within the last 3 months, symptoms must be the same or worse after treatment to support imaging and there was no contact with your provider after completing the treatment.

## 2021-12-23 NOTE — Telephone Encounter (Signed)
Please let the patient know the insurance company has denied her MRI that she has the option to pay cash.

## 2021-12-24 NOTE — Telephone Encounter (Signed)
Sent mychart msg to patient informing her of the denial with an option to pay cash for the imaging.

## 2021-12-27 ENCOUNTER — Other Ambulatory Visit: Payer: Self-pay | Admitting: Medical-Surgical

## 2022-01-16 ENCOUNTER — Ambulatory Visit (INDEPENDENT_AMBULATORY_CARE_PROVIDER_SITE_OTHER): Payer: Self-pay | Admitting: Medical-Surgical

## 2022-01-16 DIAGNOSIS — Z91199 Patient's noncompliance with other medical treatment and regimen due to unspecified reason: Secondary | ICD-10-CM

## 2022-01-17 NOTE — Progress Notes (Signed)
   Complete physical exam  Patient: Joanne [name]   DOB: 01/25/2000   51 y.o. Female  MRN: [id number]  Subjective:    No chief complaint on file.   Joanne [name] is a 51 y.o. female who presents today for a complete physical exam. She reports consuming a {diet types:17450} diet. {types:19826} She generally feels {DESC; WELL/FAIRLY WELL/POORLY:18703}. She reports sleeping {DESC; WELL/FAIRLY WELL/POORLY:18703}. She {does/does not:200015} have additional problems to discuss today.    Most recent fall risk assessment:    10/02/2021   10:42 AM  Fall Risk   Falls in the past year? 0  Number falls in past yr: 0  Injury with Fall? 0  Risk for fall due to : No Fall Risks  Follow up Falls evaluation completed     Most recent depression screenings:    10/02/2021   10:42 AM 08/23/2020   10:46 AM  PHQ 2/9 Scores  PHQ - 2 Score 0 0  PHQ- 9 Score 5     {VISON DENTAL STD PSA (Optional):27386}  {History (Optional):23778}  Patient Care Team: Hammad Finkler, NP as PCP - General (Nurse Practitioner)   Outpatient Medications Prior to Visit  Medication Sig   fluticasone (FLONASE) 50 MCG/ACT nasal spray Place 2 sprays into both nostrils in the morning and at bedtime. After 7 days, reduce to once daily.   norgestimate-ethinyl estradiol (SPRINTEC 28) 0.25-35 MG-MCG tablet Take 1 tablet by mouth daily.   Nystatin POWD Apply liberally to affected area 2 times per day   spironolactone (ALDACTONE) 100 MG tablet Take 1 tablet (100 mg total) by mouth daily.   No facility-administered medications prior to visit.    ROS        Objective:     There were no vitals taken for this visit. {Vitals History (Optional):23777}  Physical Exam   No results found for any visits on 11/07/21. {Show previous labs (optional):23779}    Assessment & Plan:    Routine Health Maintenance and Physical Exam  Immunization History  Administered Date(s) Administered   DTaP 04/10/1999, 06/06/1999,  08/15/1999, 04/30/2000, 11/14/2003   Hepatitis A 09/10/2007, 09/15/2008   Hepatitis B 01/26/1999, 03/05/1999, 08/15/1999   HiB (PRP-OMP) 04/10/1999, 06/06/1999, 08/15/1999, 04/30/2000   IPV 04/10/1999, 06/06/1999, 02/03/2000, 11/14/2003   Influenza,inj,Quad PF,6+ Mos 12/16/2013   Influenza-Unspecified 03/17/2012   MMR 02/02/2001, 11/14/2003   Meningococcal Polysaccharide 09/15/2011   Pneumococcal Conjugate-13 04/30/2000   Pneumococcal-Unspecified 08/15/1999, 10/29/1999   Tdap 09/15/2011   Varicella 02/03/2000, 09/10/2007    Health Maintenance  Topic Date Due   HIV Screening  Never done   Hepatitis C Screening  Never done   INFLUENZA VACCINE  11/05/2021   PAP-Cervical Cytology Screening  11/07/2021 (Originally 01/25/2020)   PAP SMEAR-Modifier  11/07/2021 (Originally 01/25/2020)   TETANUS/TDAP  11/07/2021 (Originally 09/14/2021)   HPV VACCINES  Discontinued   COVID-19 Vaccine  Discontinued    Discussed health benefits of physical activity, and encouraged her to engage in regular exercise appropriate for her age and condition.  Problem List Items Addressed This Visit   None Visit Diagnoses     Annual physical exam    -  Primary   Cervical cancer screening       Need for Tdap vaccination          No follow-ups on file.     Roslin Norwood, NP   

## 2022-01-28 ENCOUNTER — Encounter (HOSPITAL_BASED_OUTPATIENT_CLINIC_OR_DEPARTMENT_OTHER): Payer: Self-pay | Admitting: Orthopaedic Surgery

## 2022-01-28 ENCOUNTER — Other Ambulatory Visit: Payer: Self-pay

## 2022-02-05 NOTE — Anesthesia Preprocedure Evaluation (Addendum)
Anesthesia Evaluation  Patient identified by MRN, date of birth, ID band Patient awake    Reviewed: Allergy & Precautions, NPO status , Patient's Chart, lab work & pertinent test results  History of Anesthesia Complications Negative for: history of anesthetic complications  Airway Mallampati: II  TM Distance: >3 FB Neck ROM: Full    Dental no notable dental hx. (+) Dental Advisory Given, Edentulous Upper, Edentulous Lower   Pulmonary Current Smoker and Patient abstained from smoking.   Pulmonary exam normal        Cardiovascular negative cardio ROS Normal cardiovascular exam     Neuro/Psych  Headaches PSYCHIATRIC DISORDERS Anxiety Depression       GI/Hepatic Neg liver ROS,GERD  Medicated,,  Endo/Other  Hypothyroidism    Renal/GU negative Renal ROS     Musculoskeletal negative musculoskeletal ROS (+)    Abdominal   Peds  Hematology negative hematology ROS (+)   Anesthesia Other Findings   Reproductive/Obstetrics                             Anesthesia Physical Anesthesia Plan  ASA: 2  Anesthesia Plan: General and Regional   Post-op Pain Management: Regional block*, Tylenol PO (pre-op)* and Celebrex PO (pre-op)*   Induction: Intravenous  PONV Risk Score and Plan: 2 and Ondansetron, Dexamethasone and Midazolam  Airway Management Planned: Oral ETT  Additional Equipment:   Intra-op Plan:   Post-operative Plan: Extubation in OR  Informed Consent: I have reviewed the patients History and Physical, chart, labs and discussed the procedure including the risks, benefits and alternatives for the proposed anesthesia with the patient or authorized representative who has indicated his/her understanding and acceptance.     Dental advisory given  Plan Discussed with: Anesthesiologist and CRNA  Anesthesia Plan Comments:         Anesthesia Quick Evaluation

## 2022-02-06 ENCOUNTER — Ambulatory Visit (HOSPITAL_BASED_OUTPATIENT_CLINIC_OR_DEPARTMENT_OTHER): Payer: 59 | Admitting: Anesthesiology

## 2022-02-06 ENCOUNTER — Encounter (HOSPITAL_BASED_OUTPATIENT_CLINIC_OR_DEPARTMENT_OTHER)
Admission: RE | Disposition: A | Discharge status: Home / Self Care | Payer: Self-pay | Source: Home / Self Care | Attending: Orthopaedic Surgery

## 2022-02-06 ENCOUNTER — Other Ambulatory Visit: Payer: Self-pay

## 2022-02-06 ENCOUNTER — Encounter (HOSPITAL_BASED_OUTPATIENT_CLINIC_OR_DEPARTMENT_OTHER): Payer: Self-pay | Admitting: Orthopaedic Surgery

## 2022-02-06 ENCOUNTER — Ambulatory Visit (HOSPITAL_BASED_OUTPATIENT_CLINIC_OR_DEPARTMENT_OTHER)
Admission: RE | Admit: 2022-02-06 | Discharge: 2022-02-06 | Disposition: A | Discharge status: Home / Self Care | Payer: 59 | Attending: Orthopaedic Surgery | Admitting: Orthopaedic Surgery

## 2022-02-06 DIAGNOSIS — E079 Disorder of thyroid, unspecified: Secondary | ICD-10-CM | POA: Diagnosis not present

## 2022-02-06 DIAGNOSIS — M7541 Impingement syndrome of right shoulder: Secondary | ICD-10-CM

## 2022-02-06 DIAGNOSIS — K219 Gastro-esophageal reflux disease without esophagitis: Secondary | ICD-10-CM | POA: Diagnosis not present

## 2022-02-06 DIAGNOSIS — M19011 Primary osteoarthritis, right shoulder: Secondary | ICD-10-CM | POA: Insufficient documentation

## 2022-02-06 DIAGNOSIS — M7521 Bicipital tendinitis, right shoulder: Secondary | ICD-10-CM

## 2022-02-06 DIAGNOSIS — S43431A Superior glenoid labrum lesion of right shoulder, initial encounter: Secondary | ICD-10-CM

## 2022-02-06 DIAGNOSIS — Z01818 Encounter for other preprocedural examination: Secondary | ICD-10-CM

## 2022-02-06 DIAGNOSIS — M7531 Calcific tendinitis of right shoulder: Secondary | ICD-10-CM | POA: Diagnosis not present

## 2022-02-06 DIAGNOSIS — S46011A Strain of muscle(s) and tendon(s) of the rotator cuff of right shoulder, initial encounter: Secondary | ICD-10-CM | POA: Diagnosis present

## 2022-02-06 DIAGNOSIS — X58XXXA Exposure to other specified factors, initial encounter: Secondary | ICD-10-CM | POA: Diagnosis not present

## 2022-02-06 DIAGNOSIS — M65221 Calcific tendinitis, right upper arm: Secondary | ICD-10-CM

## 2022-02-06 HISTORY — PX: SHOULDER ARTHROSCOPY WITH SUBACROMIAL DECOMPRESSION, ROTATOR CUFF REPAIR AND BICEP TENDON REPAIR: SHX5687

## 2022-02-06 HISTORY — PX: SHOULDER ARTHROSCOPY WITH DISTAL CLAVICLE RESECTION: SHX5675

## 2022-02-06 SURGERY — SHOULDER ARTHROSCOPY WITH SUBACROMIAL DECOMPRESSION, ROTATOR CUFF REPAIR AND BICEP TENDON REPAIR
Anesthesia: Regional | Site: Shoulder | Laterality: Right

## 2022-02-06 MED ORDER — PHENYLEPHRINE HCL (PRESSORS) 10 MG/ML IV SOLN
INTRAVENOUS | Status: AC
  Filled 2022-02-06: qty 1

## 2022-02-06 MED ORDER — EPHEDRINE 5 MG/ML INJ
INTRAVENOUS | Status: AC
  Filled 2022-02-06: qty 10

## 2022-02-06 MED ORDER — OXYCODONE HCL 5 MG PO TABS: ORAL_TABLET | ORAL | 0 refills | Status: DC

## 2022-02-06 MED ORDER — CELECOXIB 200 MG PO CAPS
200.0000 mg | ORAL_CAPSULE | Freq: Once | ORAL | Status: AC
  Administered 2022-02-06: 200 mg via ORAL

## 2022-02-06 MED ORDER — CELECOXIB 100 MG PO CAPS: 100.0000 mg | ORAL_CAPSULE | Freq: Two times a day (BID) | ORAL | 0 refills | Status: AC

## 2022-02-06 MED ORDER — ONDANSETRON HCL 4 MG/2ML IJ SOLN
INTRAMUSCULAR | Status: AC
  Filled 2022-02-06: qty 2

## 2022-02-06 MED ORDER — ROCURONIUM BROMIDE 10 MG/ML (PF) SYRINGE
PREFILLED_SYRINGE | INTRAVENOUS | Status: AC
  Filled 2022-02-06: qty 10

## 2022-02-06 MED ORDER — GABAPENTIN 300 MG PO CAPS
ORAL_CAPSULE | ORAL | Status: AC
  Filled 2022-02-06: qty 1

## 2022-02-06 MED ORDER — ROCURONIUM BROMIDE 100 MG/10ML IV SOLN
INTRAVENOUS | Status: DC | PRN
  Administered 2022-02-06: 70 mg via INTRAVENOUS

## 2022-02-06 MED ORDER — FENTANYL CITRATE (PF) 100 MCG/2ML IJ SOLN
INTRAMUSCULAR | Status: AC
  Filled 2022-02-06: qty 2

## 2022-02-06 MED ORDER — PROPOFOL 10 MG/ML IV BOLUS
INTRAVENOUS | Status: DC | PRN
  Administered 2022-02-06: 180 mg via INTRAVENOUS

## 2022-02-06 MED ORDER — SUGAMMADEX SODIUM 200 MG/2ML IV SOLN
INTRAVENOUS | Status: DC | PRN
  Administered 2022-02-06: 270 mg via INTRAVENOUS

## 2022-02-06 MED ORDER — MIDAZOLAM HCL 2 MG/2ML IJ SOLN
INTRAMUSCULAR | Status: AC
  Filled 2022-02-06: qty 2

## 2022-02-06 MED ORDER — EPHEDRINE SULFATE (PRESSORS) 50 MG/ML IJ SOLN
INTRAMUSCULAR | Status: DC | PRN
  Administered 2022-02-06: 5 mg via INTRAVENOUS
  Administered 2022-02-06 (×2): 10 mg via INTRAVENOUS

## 2022-02-06 MED ORDER — BUPIVACAINE LIPOSOME 1.3 % IJ SUSP
INTRAMUSCULAR | Status: DC | PRN
  Administered 2022-02-06: 10 mL via PERINEURAL

## 2022-02-06 MED ORDER — ONDANSETRON HCL 4 MG/2ML IJ SOLN
INTRAMUSCULAR | Status: DC | PRN
  Administered 2022-02-06: 4 mg via INTRAVENOUS

## 2022-02-06 MED ORDER — ACETAMINOPHEN 500 MG PO TABS
1000.0000 mg | ORAL_TABLET | Freq: Once | ORAL | Status: AC
  Administered 2022-02-06: 1000 mg via ORAL

## 2022-02-06 MED ORDER — LACTATED RINGERS IV SOLN: INTRAVENOUS | Status: DC

## 2022-02-06 MED ORDER — OXYCODONE HCL 5 MG PO TABS: ORAL_TABLET | ORAL | 0 refills | Status: AC

## 2022-02-06 MED ORDER — FENTANYL CITRATE (PF) 100 MCG/2ML IJ SOLN
25.0000 ug | INTRAMUSCULAR | Status: DC | PRN
  Administered 2022-02-06 (×3): 50 ug via INTRAVENOUS

## 2022-02-06 MED ORDER — CELECOXIB 200 MG PO CAPS
ORAL_CAPSULE | ORAL | Status: AC
  Filled 2022-02-06: qty 1

## 2022-02-06 MED ORDER — DEXAMETHASONE SODIUM PHOSPHATE 10 MG/ML IJ SOLN
INTRAMUSCULAR | Status: AC
  Filled 2022-02-06: qty 1

## 2022-02-06 MED ORDER — PROMETHAZINE HCL 25 MG/ML IJ SOLN: 6.2500 mg | INTRAMUSCULAR | Status: DC | PRN

## 2022-02-06 MED ORDER — DEXAMETHASONE SODIUM PHOSPHATE 10 MG/ML IJ SOLN
INTRAMUSCULAR | Status: DC | PRN
  Administered 2022-02-06: 10 mg via INTRAVENOUS

## 2022-02-06 MED ORDER — ACETAMINOPHEN 500 MG PO TABS
ORAL_TABLET | ORAL | Status: AC
  Filled 2022-02-06: qty 2

## 2022-02-06 MED ORDER — PROPOFOL 10 MG/ML IV BOLUS
INTRAVENOUS | Status: AC
  Filled 2022-02-06: qty 20

## 2022-02-06 MED ORDER — BUPIVACAINE HCL (PF) 0.5 % IJ SOLN
INTRAMUSCULAR | Status: DC | PRN
  Administered 2022-02-06: 15 mL via PERINEURAL

## 2022-02-06 MED ORDER — FENTANYL CITRATE (PF) 100 MCG/2ML IJ SOLN
100.0000 ug | Freq: Once | INTRAMUSCULAR | Status: AC
  Administered 2022-02-06: 100 ug via INTRAVENOUS

## 2022-02-06 MED ORDER — ACETAMINOPHEN 500 MG PO TABS: 1000.0000 mg | ORAL_TABLET | Freq: Three times a day (TID) | ORAL | 0 refills | Status: AC

## 2022-02-06 MED ORDER — CEFAZOLIN SODIUM-DEXTROSE 2-4 GM/100ML-% IV SOLN
2.0000 g | INTRAVENOUS | Status: AC
  Administered 2022-02-06: 2 g via INTRAVENOUS

## 2022-02-06 MED ORDER — MIDAZOLAM HCL 2 MG/2ML IJ SOLN
2.0000 mg | Freq: Once | INTRAMUSCULAR | Status: AC
  Administered 2022-02-06: 2 mg via INTRAVENOUS

## 2022-02-06 MED ORDER — PHENYLEPHRINE 80 MCG/ML (10ML) SYRINGE FOR IV PUSH (FOR BLOOD PRESSURE SUPPORT)
PREFILLED_SYRINGE | INTRAVENOUS | Status: AC
  Filled 2022-02-06: qty 10

## 2022-02-06 MED ORDER — PHENYLEPHRINE HCL-NACL 20-0.9 MG/250ML-% IV SOLN
INTRAVENOUS | Status: DC | PRN
  Administered 2022-02-06: 25 ug/min via INTRAVENOUS

## 2022-02-06 MED ORDER — ONDANSETRON HCL 4 MG PO TABS: 4.0000 mg | ORAL_TABLET | Freq: Three times a day (TID) | ORAL | 0 refills | Status: AC | PRN

## 2022-02-06 MED ORDER — PHENYLEPHRINE HCL (PRESSORS) 10 MG/ML IV SOLN
INTRAVENOUS | Status: DC | PRN
  Administered 2022-02-06: 160 ug via INTRAVENOUS

## 2022-02-06 MED ORDER — SODIUM CHLORIDE 0.9 % IR SOLN
Status: DC | PRN
  Administered 2022-02-06: 6000 mL

## 2022-02-06 MED ORDER — GABAPENTIN 300 MG PO CAPS
300.0000 mg | ORAL_CAPSULE | Freq: Once | ORAL | Status: AC
  Administered 2022-02-06: 300 mg via ORAL

## 2022-02-06 MED ORDER — ACETAMINOPHEN 500 MG PO TABS: 1000.0000 mg | ORAL_TABLET | Freq: Once | ORAL | Status: AC

## 2022-02-06 MED ORDER — AMISULPRIDE (ANTIEMETIC) 5 MG/2ML IV SOLN: 10.0000 mg | Freq: Once | INTRAVENOUS | Status: DC | PRN

## 2022-02-06 SURGICAL SUPPLY — 54 items
ANCHOR SUT 1.8 FIBERTAK SB KL (Anchor) IMPLANT
ANCHOR SUT BIO SW 4.75X19.1 (Anchor) IMPLANT
BLADE EXCALIBUR 4.0X13 (MISCELLANEOUS) ×1 IMPLANT
BURR OVAL 8 FLU 4.0X13 (MISCELLANEOUS) ×1 IMPLANT
CANNULA 5.75X71 LONG (CANNULA) IMPLANT
CANNULA PASSPORT 5 (CANNULA) IMPLANT
CANNULA PASSPORT BUTTON 10-40 (CANNULA) IMPLANT
CANNULA TWIST IN 8.25X7CM (CANNULA) IMPLANT
CHLORAPREP W/TINT 26 (MISCELLANEOUS) ×1 IMPLANT
CLSR STERI-STRIP ANTIMIC 1/2X4 (GAUZE/BANDAGES/DRESSINGS) ×1 IMPLANT
COOLER ICEMAN CLASSIC (MISCELLANEOUS) ×1 IMPLANT
DRAPE IMP U-DRAPE 54X76 (DRAPES) ×1 IMPLANT
DRAPE INCISE IOBAN 66X45 STRL (DRAPES) IMPLANT
DRAPE SHOULDER BEACH CHAIR (DRAPES) ×1 IMPLANT
DW OUTFLOW CASSETTE/TUBE SET (MISCELLANEOUS) ×1 IMPLANT
GAUZE PAD ABD 8X10 STRL (GAUZE/BANDAGES/DRESSINGS) ×1 IMPLANT
GAUZE SPONGE 4X4 12PLY STRL (GAUZE/BANDAGES/DRESSINGS) ×1 IMPLANT
GLOVE BIO SURGEON STRL SZ 6.5 (GLOVE) ×1 IMPLANT
GLOVE BIOGEL PI IND STRL 6.5 (GLOVE) ×1 IMPLANT
GLOVE BIOGEL PI IND STRL 8 (GLOVE) ×1 IMPLANT
GLOVE ECLIPSE 8.0 STRL XLNG CF (GLOVE) ×1 IMPLANT
GOWN STRL REUS W/ TWL LRG LVL3 (GOWN DISPOSABLE) ×2 IMPLANT
GOWN STRL REUS W/TWL LRG LVL3 (GOWN DISPOSABLE) ×2
GOWN STRL REUS W/TWL XL LVL3 (GOWN DISPOSABLE) ×1 IMPLANT
KIT STABILIZATION SHOULDER (MISCELLANEOUS) ×1 IMPLANT
KIT STR SPEAR 1.8 FBRTK DISP (KITS) IMPLANT
LASSO CRESCENT QUICKPASS (SUTURE) IMPLANT
MANIFOLD NEPTUNE II (INSTRUMENTS) ×1 IMPLANT
NDL HD SCORPION MEGA LOADER (NEEDLE) IMPLANT
NDL SAFETY ECLIP 18X1.5 (MISCELLANEOUS) ×1 IMPLANT
NDL SCORPION MULTI FIRE (NEEDLE) IMPLANT
NEEDLE SCORPION MULTI FIRE (NEEDLE) IMPLANT
PACK ARTHROSCOPY DSU (CUSTOM PROCEDURE TRAY) ×1 IMPLANT
PACK BASIN DAY SURGERY FS (CUSTOM PROCEDURE TRAY) ×1 IMPLANT
PAD COLD SHLDR WRAP-ON (PAD) ×1 IMPLANT
PORT APPOLLO RF 90DEGREE MULTI (SURGICAL WAND) ×1 IMPLANT
RESTRAINT HEAD UNIVERSAL NS (MISCELLANEOUS) ×1 IMPLANT
SHEET MEDIUM DRAPE 40X70 STRL (DRAPES) ×1 IMPLANT
SLEEVE SCD COMPRESS KNEE MED (STOCKING) ×1 IMPLANT
SLING ARM FOAM STRAP LRG (SOFTGOODS) IMPLANT
SUT FIBERWIRE #2 38 T-5 BLUE (SUTURE)
SUT MNCRL AB 4-0 PS2 18 (SUTURE) ×1 IMPLANT
SUT PDS AB 1 CT  36 (SUTURE)
SUT PDS AB 1 CT 36 (SUTURE) IMPLANT
SUT TIGER TAPE 7 IN WHITE (SUTURE) IMPLANT
SUTURE FIBERWR #2 38 T-5 BLUE (SUTURE) IMPLANT
SUTURE TAPE TIGERLINK 1.3MM BL (SUTURE) IMPLANT
SUTURETAPE TIGERLINK 1.3MM BL (SUTURE)
SYR 5ML LL (SYRINGE) ×1 IMPLANT
TAPE FIBER 2MM 7IN #2 BLUE (SUTURE) IMPLANT
TOWEL GREEN STERILE FF (TOWEL DISPOSABLE) ×2 IMPLANT
TUBE CONNECTING 20X1/4 (TUBING) ×1 IMPLANT
TUBING ARTHROSCOPY IRRIG 16FT (MISCELLANEOUS) ×1 IMPLANT
WAND ABLATOR APOLLO I90 (BUR) IMPLANT

## 2022-02-06 NOTE — Anesthesia Postprocedure Evaluation (Signed)
Anesthesia Post Note  Patient: Joanne [name]  Procedure(s) Performed: SHOULDER ARTHROSCOPY WITH SUBACROMIAL DECOMPRESSION, ROTATOR CUFF REPAIR AND BICEP TENDON REPAIR (Right: Shoulder) SHOULDER ARTHROSCOPY WITH DISTAL CLAVICLE EXCISION (Right: Shoulder)     Patient location during evaluation: PACU Anesthesia Type: Regional and General Level of consciousness: sedated Pain management: pain level controlled Vital Signs Assessment: post-procedure vital signs reviewed and stable Respiratory status: spontaneous breathing and respiratory function stable Cardiovascular status: stable Postop Assessment: no apparent nausea or vomiting Anesthetic complications: no   No notable events documented.  Last Vitals:  Vitals:   02/06/22 1115 02/06/22 1145  BP: 113/69 127/67  Pulse: 79 71  Resp: 14 16  Temp:  (!) 36.2 C  SpO2: 94% 95%    Last Pain:  Vitals:   02/06/22 1145  TempSrc:   PainSc: 1                  Henrick Mcgue DANIEL

## 2022-02-06 NOTE — Interval H&P Note (Signed)
All questions answered, patient wants to proceed with procedure. ? ?

## 2022-02-06 NOTE — Anesthesia Procedure Notes (Signed)
Procedure Name: Intubation Date/Time: 02/06/2022 9:22 AM  Performed by: Lavonia Dana, CRNAPre-anesthesia Checklist: Patient identified, Emergency Drugs available, Suction available and Patient being monitored Patient Re-evaluated:Patient Re-evaluated prior to induction Oxygen Delivery Method: Circle system utilized Preoxygenation: Pre-oxygenation with 100% oxygen Induction Type: IV induction Ventilation: Mask ventilation without difficulty Laryngoscope Size: Mac and 3 Grade View: Grade I Tube type: Oral Tube size: 7.0 mm Number of attempts: 1 Airway Equipment and Method: Stylet and Bite block Placement Confirmation: ETT inserted through vocal cords under direct vision, positive ETCO2 and breath sounds checked- equal and bilateral Secured at: 22 cm Tube secured with: Tape Dental Injury: Teeth and Oropharynx as per pre-operative assessment

## 2022-02-06 NOTE — Progress Notes (Signed)
Assisted Dr. Tobias Alexander with right, interscalene , ultrasound guided block. Side rails up, monitors on throughout procedure. See vital signs in flow sheet. Tolerated Procedure well.

## 2022-02-06 NOTE — H&P (Signed)
PREOPERATIVE H&P  Chief Complaint: right shoulder cartilage disorder,OA, impingement syndrome, biceps tendinitis, rotator cuff tear  HPI: Joanne [name] is a 51 y.o. female who is scheduled for, Procedure(s): SHOULDER ARTHROSCOPY WITH SUBACROMIAL DECOMPRESSION, ROTATOR CUFF REPAIR AND BICEP TENDON REPAIR SHOULDER ARTHROSCOPY WITH DISTAL CLAVICLE EXCISION.   Patient has a past medical history significant for GERD and thyroid disease.   Patient has had right shoulder pain for quite some time. She has had injections repetitively in her shoulder which provided temporary relief.  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  Symptoms are rated as moderate to severe, and have been worsening.  This is significantly impairing activities of daily living.    Please see clinic note for further details on this patient's care.    She has elected for surgical management.   Past Medical History:  Diagnosis Date   Anxiety    panic attacks - several yrs. ago, pt. reports that she is claustrophobic      Arthritis    shoulders- ? bursitis    cervical cancer    cervical   Cervical dysplasia    GERD (gastroesophageal reflux disease)    Headache(784.0)    Irregular heartbeat    pt. states that she has been told that she has an irreg. heartbeat on occas.   Ovarian cyst    Thyroid disease    Tobacco abuse    Past Surgical History:  Procedure Laterality Date   ABDOMINAL HYSTERECTOMY     bowel repair /w Davinci Robot procedure    ANTERIOR CERVICAL DECOMP/DISCECTOMY FUSION N/A 01/06/2013   Procedure: ANTERIOR CERVICAL DECOMPRESSION/DISCECTOMY FUSION 2 LEVELS;  Surgeon: Sinclair Ship, MD;  Location: [facility];  Service: Orthopedics;  Laterality: N/A;  Anterior cervical decompression fusion, cervical 5-6, cervical 6-7 with instrumentation and allograft   CESAREAN SECTION  1994   tubal ligation    CHOLECYSTECTOMY N/A 09/06/2013   Procedure: LAPAROSCOPIC CHOLECYSTECTOMY WITH  INTRAOPERATIVE CHOLANGIOGRAM;  Surgeon: Joyice Faster. Cornett, MD;  Location: [facility];  Service: General;  Laterality: N/A;   DENTAL SURGERY  02-2013   implants placed   OOPHORECTOMY     TONSILLECTOMY     UPPER GASTROINTESTINAL ENDOSCOPY     Social History   Socioeconomic History   Marital status: Single    Spouse name: Not on file   Number of children: 2   Years of education: Not on file   Highest education level: Not on file  Occupational History   Occupation: Public librarian  Tobacco Use   Smoking status: Every Day    Packs/day: 1.00    Years: 30.00    Total pack years: 30.00    Types: Cigarettes   Smokeless tobacco: Never   Tobacco comments:    cut back from 2 ppd  Vaping Use   Vaping Use: Never used  Substance and Sexual Activity   Alcohol use: No   Drug use: No   Sexual activity: Not Currently    Birth control/protection: Surgical    Comment: hyst  Other Topics Concern   Not on file  Social History Narrative   Not on file   Social Determinants of Health   Financial Resource Strain: Not on file  Food Insecurity: Not on file  Transportation Needs: Not on file  Physical Activity: Not on file  Stress: Not on file  Social Connections: Not on file   Family History  Problem Relation Age of Onset   Depression Mother  Heart attack Father    Hypertension Father    Lung cancer Father    Cervical cancer Sister    Alcohol abuse Brother    Diabetes Maternal Grandmother    Colon cancer Maternal Grandmother    Thyroid cancer Sister    Esophageal cancer Neg Hx    Rectal cancer Neg Hx    Stomach cancer Neg Hx    Allergies  Allergen Reactions   Morphine And Related Anaphylaxis   Prior to Admission medications   Medication Sig Start Date End Date Taking? Authorizing Provider  diphenoxylate-atropine (LOMOTIL) 2.5-0.025 MG tablet Take 1 tablet by mouth 4 (four) times daily as needed. 10/24/21  Yes [provider]  pantoprazole (PROTONIX) 40 MG tablet TAKE 1  TABLET BY MOUTH TWICE A DAY BEFORE A MEAL 08/01/21  Yes Danis, Estill Cotta III, MD  traZODone (DESYREL) 100 MG tablet TAKE 1/2-1.5 TABLETS (50-150 MG TOTAL) BY MOUTH AT BEDTIME. 12/27/21  Yes Jessup, Joy, NP    ROS: All other systems have been reviewed and were otherwise negative with the exception of those mentioned in the HPI and as above.  Physical Exam: General: Alert, no acute distress Cardiovascular: No pedal edema Respiratory: No cyanosis, no use of accessory musculature GI: No organomegaly, abdomen is soft and non-tender Skin: No lesions in the area of chief complaint Neurologic: Sensation intact distally Psychiatric: Patient is competent for consent with normal mood and affect Lymphatic: No axillary or cervical lymphadenopathy  MUSCULOSKELETAL:  Range of motion of the shoulder is full.  Weakness with supraspinatus testing.  Intact remainder of the cuff.  Warm and well perfused extremity otherwise.  Positive O'Brien's.  Positive impingement.  Positive AC tenderness to palpation.    Imaging: MRI demonstrates calcific tendinitis, significant SLAP tear, biceps tendinitis with tenosynovitis of the biceps. 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   Assessment: right shoulder cartilage disorder,OA, impingement syndrome, biceps tendinitis, rotator cuff tear  Plan: Plan for Procedure(s): SHOULDER ARTHROSCOPY WITH SUBACROMIAL DECOMPRESSION, ROTATOR CUFF REPAIR AND BICEP TENDON REPAIR SHOULDER ARTHROSCOPY WITH DISTAL CLAVICLE EXCISION   The risks benefits and alternatives were discussed with the patient including but not limited to the risks of nonoperative treatment, versus surgical intervention including infection, bleeding, nerve injury,  blood clots, cardiopulmonary complications, morbidity, mortality, among others, and they were willing to proceed.   The patient acknowledged the explanation, agreed to proceed with the plan and consent was signed.    Operative Plan: Right shoulder scope with a rotator cuff repair, biceps tenodesis, subacromial decompression and distal clavicle excision Discharge Medications: Standard DVT Prophylaxis: None Physical Therapy: Outpatient PT Special Discharge needs: Sling. IceMan   Ethelda Chick, PA-C  02/06/2022 6:15 AM

## 2022-02-06 NOTE — Anesthesia Procedure Notes (Signed)
Anesthesia Regional Block: Interscalene brachial plexus block   Pre-Anesthetic Checklist: , timeout performed,  Correct Patient, Correct Site, Correct Laterality,  Correct Procedure, Correct Position, site marked,  Risks and benefits discussed,  Surgical consent,  Pre-op evaluation,  At surgeon's request and post-op pain management  Laterality: Right  Prep: chloraprep       Needles:  Injection technique: Single-shot  Needle Type: Echogenic Stimulator Needle     Needle Length: 5cm  Needle Gauge: 22     Additional Needles:   Narrative:  Start time: 02/06/2022 8:34 AM End time: 02/06/2022 8:44 AM Injection made incrementally with aspirations every 5 mL.  Performed by: Personally  Anesthesiologist: Duane Boston, MD  Additional Notes: Functioning IV was confirmed and monitors applied.  A 46m 22ga echogenic arrow stimulator was used. Sterile prep and drape,hand hygiene and sterile gloves were used.Ultrasound guidance: relevant anatomy identified, needle position confirmed, local anesthetic spread visualized around nerve(s)., vascular puncture avoided.  Image printed for medical record.  Negative aspiration and negative test dose prior to incremental administration of local anesthetic. The patient tolerated the procedure well.

## 2022-02-06 NOTE — Op Note (Signed)
Orthopaedic Surgery Operative Note (CSN: [id number])  Joanne [name]  18-Oct-1970 Date of Surgery: 02/06/2022   DIAGNOSES: Right shoulder, SLAP tear, biceps tendinitis, AC arthritis, subacromial impingement, and calcific tendinitis .  POST-OPERATIVE DIAGNOSIS: same  PROCEDURE: Arthroscopic extensive debridement - 29823 Subdeltoid Bursa, Supraspinatus Tendon, Anterior Labrum, Posterior Labrum, and glenoid bone, glenoid cartilage, humeral bone, Humeral cartilage and calcium deposits in cuff Arthroscopic distal clavicle excision - 30160 Arthroscopic subacromial decompression - 10932 Arthroscopic rotator cuff repair - 35573 Arthroscopic biceps tenodesis - 22025   OPERATIVE FINDING: Exam under anesthesia: Normal Articular space: Normal Chondral surfaces: Normal Biceps: Type II SLAP tear with significant tearing of the tendon itself is anchors to the labrum Subscapularis: Intact  Supraspinatus: Large amount of calcium deposit in the supraspinatus.  We debrided a large amount of it however I was worried that debriding all of it would end up leaving very little tendon.  We debulked as much as we could.  A single anchor speed fix repair was performed. Infraspinatus: Complete tear   I debulk the majority of the calcium however I expect on imaging that it will still be present in some fashion.  This was a bit of a judgment call however I think it was the appropriate thing to do.   Post-operative plan: The patient will be non-weightbearing in a sling for 6 weeks.  The patient will be discharged home.  DVT prophylaxis not indicated in ambulatory upper extremity patient without known risk factors.   Pain control with PRN pain medication preferring oral medicines.  Follow up plan will be scheduled in approximately 7 days for incision check and XR.  Surgeons:Primary: Hiram Gash, MD Assistants:Caroline McBane PA-C Location: [facility] ROOM 6 Anesthesia: General with Exparel interscalene  block Antibiotics: Ancef 2 g Tourniquet time: None Estimated Blood Loss: Minimal Complications: None Specimens: None Implants: Implant Name Type Inv. Item Serial No. Manufacturer Lot No. LRB No. Used Action  ANCHOR SUT 1.8 FIBERTAK SB KL - [id number] Anchor ANCHOR SUT 1.8 FIBERTAK SB KL  [employer] [id number] Right 1 Implanted  ANCHOR SUT 1.8 FIBERTAK SB KL - [id number] Anchor ANCHOR SUT 1.8 FIBERTAK SB KL  [employer] [id number] Right 1 Implanted  ANCHOR SUT BIO SW 4.75X19.1 - [id number] Anchor ANCHOR SUT BIO SW 4.75X19.1  [name] [id number] Right 1 Implanted    Indications for Surgery:   NATALE [name] is a 51 y.o. female with continued shoulder pain refractory to nonoperative measures for extended period of time.    The risks and benefits were explained at length including but not limited to continued pain, cuff failure, biceps tenodesis failure, stiffness, need for further surgery and infection.   Procedure:   Patient was correctly identified in the preoperative holding area and operative site marked.  Patient brought to OR and positioned beachchair on an [place] table ensuring that all bony prominences were padded and the head was in an appropriate location.  Anesthesia was induced and the operative shoulder was prepped and draped in the usual sterile fashion.  Timeout was called preincision.  A standard posterior viewing portal was made after localizing the portal with a spinal needle.  An anterior accessory portal was also made.  After clearing the articular space the camera was positioned in the subacromial space.  Findings above.    Extensive debridement was performed of the anterior interval tissue, labral fraying and the bursa.  Glenoid bone, glenoid cartilage, humeral bone, humeral cartilage and supraspinatus calcium deposits were also debrided.  Subacromial  decompression: We made a lateral portal with spinal needle guidance. We then proceeded to debride bursal tissue extensively  with a shaver and arthrocare device. At that point we continued to identify the borders of the acromion and identify the spur. We then carefully preserved the deltoid fascia and used a burr to convert the acromion to a Type 1 flat acromion without issue.   Biceps tenodesis: We marked the tendon and then performed a tenotomy and debridement of the stump in the articular space. We then identified the biceps tendon in its groove suprapec with the arthroscope in the lateral portal taking care to move from lateral to medial to avoid injury to the subscapularis. At that point we unroofed the tendon itself and mobilized it. An accessory anterior portal was made in line with the tendon and we grasped it from the anterior superior portal and worked from the accessory anterior portal. Two Fibertak 1.30m knotless anchors were placed in the groove and the tendon was secured in a luggage loop style fashion with a pass of the limb of suture through the tendon using a scorpion device to avoid pull-through.  Repair was completed with good tension on the tendon.  Residual stump of the tendon was removed after being resected with a RF ablator.  Distal Clavicle resection:  The scope was placed in the subacromial space from the posterior portal.  A hemostat was placed through the anterior portal and [facility].  A burr was then inserted and 10 mm of distal clavicle was resected taking care to avoid damage to the capsule around the joint and avoiding overhanging bone posteriorly.    Rotator cuff: We identified the calcium deposits and used a blunt probe due to the bulk the majority of the calcium deposits.  We used a shaver to clean this up.  Unfortunately at the came a full-thickness lesion at that point.  We worried about debriding every bit of calcium as it would likely have left a irreparable defect.  We instead left some of it and tried to probe vault as much as we could.  We used a fiber tape passed with a  scorpion in an inverted mattress fashion and placed a 4.75 bio composite swivel lock 8 to 10 mm below the tuberosity to perform a speed fix repair.  Tissue quality was good.  The incisions were closed with absorbable monocryl and steri strips.  A sterile dressing was placed along with a sling. The patient was awoken from general anesthesia and taken to the PACU in stable condition without complication.   CNoemi Chapel PA-C, present and scrubbed throughout the case, critical for completion in a timely fashion, and for retraction, instrumentation, closure.

## 2022-02-06 NOTE — Discharge Instructions (Addendum)
No tylenol or ibuprofen until 2:00 p.m.  Ophelia Charter MD, MPH Noemi Chapel, PA-C [place] [address] [phone] (tel)   [phone] (fax)   POST-OPERATIVE INSTRUCTIONS - SHOULDER ARTHROSCOPY  WOUND CARE You may remove the Operative Dressing on Post-Op Day #3 (72hrs after surgery).   Alternatively if you would like you can leave dressing on until follow-up if within 7-8 days but keep it dry. Leave steri-strips in place until they fall off on their own, usually 2 weeks postop. There may be a small amount of fluid/bleeding leaking at the surgical site.  This is normal; the shoulder is filled with fluid during the procedure and can leak for 24-48hrs after surgery.  You may change/reinforce the bandage as needed.  Use the Cryocuff or Ice as often as possible for the first 7 days, then as needed for pain relief. Always keep a towel, ACE wrap or other barrier between the cooling unit and your skin.  You may shower on Post-Op Day #3. Gently pat the area dry.  Do not soak the shoulder in water or submerge it.  Keep incisions as dry as possible. Do not go swimming in the pool or ocean until 4 weeks after surgery or when otherwise instructed.    EXERCISES Wear the sling at all times  You may remove the sling for showering, but keep the arm across the chest or in a secondary sling.     It is normal for your fingers/hand to become more swollen after surgery and discolored from bruising.   This will resolve over the first few weeks usually after surgery. Please continue to ambulate and do not stay sitting or lying for too long.  Perform foot and wrist pumps to assist in circulation.  PHYSICAL THERAPY - You will begin physical therapy soon after surgery (unless otherwise specified) - Please call to set up an appointment, if you do not already have one  - Let our office if there are any issues with scheduling your therapy  - You have a physical therapy  appointment scheduled at [facility] (across the hall from our office) on Tuesday November 14th at 10:15 am   REGIONAL ANESTHESIA ([place]) The anesthesia team may have performed a nerve block for you this is a great tool used to minimize pain.   The block may start wearing off overnight (between 8-24 hours postop) When the block wears off, your pain may go from nearly zero to the pain you would have had postop without the block. This is an abrupt transition but nothing dangerous is happening.   This can be a challenging period but utilize your as needed pain medications to try and manage this period. We suggest you use the pain medication the first night prior to going to bed, to ease this transition.  You may take an extra dose of narcotic when this happens if needed  POST-OP MEDICATIONS- Multimodal approach to pain control In general your pain will be controlled with a combination of substances.  Prescriptions unless otherwise discussed are electronically sent to your pharmacy.  This is a carefully made plan we use to minimize narcotic use.     Celebrex - Anti-inflammatory medication taken on a scheduled basis Acetaminophen - Non-narcotic pain medicine taken on a scheduled basis  Oxycodone - This is a strong narcotic, to be used only on an "as needed" basis for SEVERE pain. Zofran - take as needed for nausea  FOLLOW-UP If you develop a  Fever (?101.5), Redness or Drainage from the surgical incision site, please call our office to arrange for an evaluation. Please call the office to schedule a follow-up appointment for your first post-operative appointment, 7-10 days post-operatively.    HELPFUL INFORMATION   You may be more comfortable sleeping in a semi-seated position the first few nights following surgery.  Keep a pillow propped under the elbow and forearm for comfort.  If you have a recliner type of chair it might be beneficial.  If not that is fine too, but it would be helpful to  sleep propped up with pillows behind your operated shoulder as well under your elbow and forearm.  This will reduce pulling on the suture lines.  When dressing, put your operative arm in the sleeve first.  When getting undressed, take your operative arm out last.  Loose fitting, button-down shirts are recommended.  Often in the first days after surgery you may be more comfortable keeping your operative arm under your shirt and not through the sleeve.  You may return to work/school in the next couple of days when you feel up to it.  Desk work and typing in the sling is fine.  We suggest you use the pain medication the first night prior to going to bed, in order to ease any pain when the anesthesia wears off. You should avoid taking pain medications on an empty stomach as it will make you nauseous.  You should wean off your narcotic medicines as soon as you are able.  Most patients will be off or using minimal narcotics before their first postop appointment.   Do not drink alcoholic beverages or take illicit drugs when taking pain medications.  It is against the law to drive while taking narcotics.  In some states it is against the law to drive while your arm is in a sling.   Pain medication may make you constipated.  Below are a few solutions to try in this order: Decrease the amount of pain medication if you aren't having pain. Drink lots of decaffeinated fluids. Drink prune juice and/or eat dried prunes  If the first 3 don't work start with additional solutions Take Colace - an over-the-counter stool softener Take Senokot - an over-the-counter laxative Take Miralax - a stronger over-the-counter laxative  For more information including helpful videos and documents visit our website:   [email or web address]   Regional Anesthesia Blocks  1. Numbness or the inability to move the "blocked" extremity may last from 3-48 hours after placement. The length of time  depends on the medication injected and your individual response to the medication. If the numbness is not going away after 48 hours, call your surgeon.  2. The extremity that is blocked will need to be protected until the numbness is gone and the  Strength has returned. Because you cannot feel it, you will need to take extra care to avoid injury. Because it may be weak, you may have difficulty moving it or using it. You may not know what position it is in without looking at it while the block is in effect.  3. For blocks in the legs and feet, returning to weight bearing and walking needs to be done carefully. You will need to wait until the numbness is entirely gone and the strength has returned. You should be able to move your leg and foot normally before you try and bear weight or walk. You will need someone to be with you when you first try to  ensure you do not fall and possibly risk injury.  4. Bruising and tenderness at the needle site are common side effects and will resolve in a few days.  5. Persistent numbness or new problems with movement should be communicated to the surgeon or the [facility] [phone] [place] [phone]).Postoperative Anesthesia Instructions-Pediatric  Activity: Your child should rest for the remainder of the day. A responsible individual must stay with your child for 24 hours.  Meals: Your child should start with liquids and light foods such as gelatin or soup unless otherwise instructed by the physician. Progress to regular foods as tolerated. Avoid spicy, greasy, and heavy foods. If nausea and/or vomiting occur, drink only clear liquids such as apple juice or Pedialyte until the nausea and/or vomiting subsides. Call your physician if vomiting continues.  Special Instructions/Symptoms: Your child may be drowsy for the rest of the day, although some children experience some hyperactivity a few hours after the surgery. Your child  may also experience some irritability or crying episodes due to the operative procedure and/or anesthesia. Your child's throat may feel dry or sore from the anesthesia or the breathing tube placed in the throat during surgery. Use throat lozenges, sprays, or ice chips if needed. Information for Discharge Teaching: EXPAREL (bupivacaine liposome injectable suspension)   Your surgeon or anesthesiologist gave you EXPAREL(bupivacaine) to help control your pain after surgery.  EXPAREL is a local anesthetic that provides pain relief by numbing the tissue around the surgical site. EXPAREL is designed to release pain medication over time and can control pain for up to 72 hours. Depending on how you respond to EXPAREL, you may require less pain medication during your recovery.  Possible side effects: Temporary loss of sensation or ability to move in the area where bupivacaine was injected. Nausea, vomiting, constipation Rarely, numbness and tingling in your mouth or lips, lightheadedness, or anxiety may occur. Call your doctor right away if you think you may be experiencing any of these sensations, or if you have other questions regarding possible side effects.  Follow all other discharge instructions given to you by your surgeon or nurse. Eat a healthy diet and drink plenty of water or other fluids.  If you return to the hospital for any reason within 96 hours following the administration of EXPAREL, it is important for health care providers to know that you have received this anesthetic. A teal colored band has been placed on your arm with the date, time and amount of EXPAREL you have received in order to alert and inform your health care providers. Please leave this armband in place for the full 96 hours following administration, and then you may remove the band.Donjoy Ultrasling III (Red ball):  Please contact your surgeon if you have questions or concerns about your sling.   Donjoy Ultrasling III  (Red ball):  Please contact your surgeon if you have questions or concerns about your sling.   Donjoy Ultrasling III (Red ball):  Please contact your surgeon if you have questions or concerns about your sling.

## 2022-02-06 NOTE — Transfer of Care (Signed)
Immediate Anesthesia Transfer of Care Note  Patient: Joanne [name]  Procedure(s) Performed: SHOULDER ARTHROSCOPY WITH SUBACROMIAL DECOMPRESSION, ROTATOR CUFF REPAIR AND BICEP TENDON REPAIR (Right: Shoulder) SHOULDER ARTHROSCOPY WITH DISTAL CLAVICLE EXCISION (Right: Shoulder)  Patient Location: PACU  Anesthesia Type:GA combined with regional for post-op pain  Level of Consciousness: drowsy  Airway & Oxygen Therapy: Patient Spontanous Breathing and Patient connected to face mask oxygen  Post-op Assessment: Report given to RN and Post -op Vital signs reviewed and stable  Post vital signs: Reviewed and stable  Last Vitals:  Vitals Value Taken Time  BP 123/72 02/06/22 1034  Temp    Pulse 87 02/06/22 1035  Resp    SpO2 99 % 02/06/22 1035  Vitals shown include unvalidated device data.  Last Pain:  Vitals:   02/06/22 0749  TempSrc: Oral  PainSc: 0-No pain         Complications: No notable events documented.

## 2022-02-07 ENCOUNTER — Encounter (HOSPITAL_BASED_OUTPATIENT_CLINIC_OR_DEPARTMENT_OTHER): Payer: Self-pay | Admitting: Orthopaedic Surgery

## 2022-02-20 ENCOUNTER — Other Ambulatory Visit: Payer: Self-pay | Admitting: Gastroenterology

## 2022-02-20 DIAGNOSIS — K2270 Barrett's esophagus without dysplasia: Secondary | ICD-10-CM

## 2022-03-27 ENCOUNTER — Ambulatory Visit (INDEPENDENT_AMBULATORY_CARE_PROVIDER_SITE_OTHER): Payer: 59 | Admitting: Medical-Surgical

## 2022-03-27 ENCOUNTER — Encounter: Payer: Self-pay | Admitting: Medical-Surgical

## 2022-03-27 VITALS — BP 105/71 | HR 96 | Temp 97.8°F | Resp 20 | Ht 65.0 in | Wt 143.0 lb

## 2022-03-27 DIAGNOSIS — R300 Dysuria: Secondary | ICD-10-CM

## 2022-03-27 DIAGNOSIS — E559 Vitamin D deficiency, unspecified: Secondary | ICD-10-CM | POA: Insufficient documentation

## 2022-03-27 DIAGNOSIS — Z6829 Body mass index (BMI) 29.0-29.9, adult: Secondary | ICD-10-CM | POA: Insufficient documentation

## 2022-03-27 DIAGNOSIS — J0100 Acute maxillary sinusitis, unspecified: Secondary | ICD-10-CM

## 2022-03-27 DIAGNOSIS — E786 Lipoprotein deficiency: Secondary | ICD-10-CM | POA: Insufficient documentation

## 2022-03-27 DIAGNOSIS — N951 Menopausal and female climacteric states: Secondary | ICD-10-CM | POA: Insufficient documentation

## 2022-03-27 DIAGNOSIS — E063 Autoimmune thyroiditis: Secondary | ICD-10-CM | POA: Insufficient documentation

## 2022-03-27 LAB — POCT URINALYSIS DIP (CLINITEK)
Glucose, UA: NEGATIVE mg/dL
Ketones, POC UA: NEGATIVE mg/dL
Leukocytes, UA: NEGATIVE
Nitrite, UA: NEGATIVE
POC PROTEIN,UA: 30 — AB
Spec Grav, UA: >=1.03 — AB (ref 1.010–1.025)
Urobilinogen, UA: 0.2 E.U./dL
pH, UA: 5.5 (ref 5.0–8.0)

## 2022-03-27 MED ORDER — AMOXICILLIN-POT CLAVULANATE 875-125 MG PO TABS: 1.0000 | ORAL_TABLET | Freq: Two times a day (BID) | ORAL | 0 refills | Status: DC

## 2022-03-27 MED ORDER — NITROFURANTOIN MONOHYD MACRO 100 MG PO CAPS: 100.0000 mg | ORAL_CAPSULE | Freq: Two times a day (BID) | ORAL | 0 refills | Status: DC

## 2022-03-27 MED ORDER — PHENAZOPYRIDINE HCL 100 MG PO TABS: 100.0000 mg | ORAL_TABLET | Freq: Three times a day (TID) | ORAL | 0 refills | Status: DC | PRN

## 2022-03-27 MED ORDER — FLUCONAZOLE 150 MG PO TABS: 150.0000 mg | ORAL_TABLET | Freq: Once | ORAL | 0 refills | Status: AC

## 2022-03-27 NOTE — Progress Notes (Signed)
Established Patient Office Visit  Subjective   Patient ID: Joanne [name], female   DOB: [birth year] Age: 51 y.o. MRN: [id number]   Chief Complaint  Patient presents with   Urinary Tract Infection   Sinusitis   HPI Pleasant 52 year old female presenting today for the following:  Sinus symptoms: Reports 1-1.5 weeks of upper respiratory symptoms with high fever, sinus congestion, headache, facial pain,  and thick green nasal discharge. Has been using Vick's VapoRub, Tylenol, and Epsom Salts in a hot bath with minimal relief of symptoms. Reports her sister is in the hospital with flu and pneumonia.  Urinary symptoms: Over the last week, has been having urgency, frequency, and bladder spasms. Symptoms were severe but they have improved a little. Now just experiencing burning at the end of urination. Tried AZO which worked for the 1st 2-3 days but then didn't help any. Admits to not drinking enough water. Sexually active in a monogamous relationship with her female partner, not using condoms. No vaginal symptoms or discharge. No urinary or vaginal odor.    Objective:    Vitals:   03/27/22 1358  BP: 105/71  Pulse: 96  Temp: 97.8 F (36.6 C)  Resp: 20  Height: '5\' 5"'$  (1.651 m)  Weight: 143 lb (64.9 kg)  SpO2: 99%  BMI (Calculated): 23.8    Physical Exam Vitals and nursing note reviewed.  Constitutional:      General: She is not in acute distress.    Appearance: Normal appearance. She is not ill-appearing.  HENT:     Head: Normocephalic and atraumatic.  Cardiovascular:     Rate and Rhythm: Normal rate and regular rhythm.     Pulses: Normal pulses.     Heart sounds: Normal heart sounds.  Pulmonary:     Effort: Pulmonary effort is normal. No respiratory distress.     Breath sounds: Normal breath sounds. No wheezing, rhonchi or rales.  Skin:    General: Skin is warm and dry.  Neurological:     Mental Status: She is alert and oriented to person, place, and time.  Psychiatric:         Mood and Affect: Mood normal.        Behavior: Behavior normal.        Thought Content: Thought content normal.        Judgment: Judgment normal.    Results for orders placed or performed in visit on 03/27/22 (from the past 24 hour(s))  POCT URINALYSIS DIP (CLINITEK)     Status: Abnormal   Collection Time: 03/27/22  4:27 PM  Result Value Ref Range   Color, UA yellow yellow   Clarity, UA clear clear   Glucose, UA negative negative mg/dL   Bilirubin, UA small (A) negative   Ketones, POC UA negative negative mg/dL   Spec Grav, UA >=1.030 (A) 1.010 - 1.025   Blood, UA small (A) negative   pH, UA 5.5 5.0 - 8.0   POC PROTEIN,UA =30 (A) negative, trace   Urobilinogen, UA 0.2 0.2 or 1.0 E.U./dL   Nitrite, UA Negative Negative   Leukocytes, UA Negative Negative       The 10-year ASCVD risk score (Arnett DK, et al., 2019) is: 2.5%   Values used to calculate the score:     Age: 56 years     Sex: Female     Is Non-Hispanic African American: No     Diabetic: No     Tobacco smoker: Yes     Systolic  Blood Pressure: 105 mmHg     Is BP treated: No     HDL Cholesterol: 52 mg/dL     Total Cholesterol: 178 mg/dL   Assessment & Plan:   1. Dysuria POCT UA + elevated specific gravity, small bilirubin, protein, and small blood, - for nitrites. Sending for culture. Wet prep stat. Start pyridium TID prn.  - POCT URINALYSIS DIP (CLINITEK) - phenazopyridine (PYRIDIUM) 100 MG tablet; Take 1 tablet (100 mg total) by mouth 3 (three) times daily as needed for pain.  Dispense: 15 tablet; Refill: 0 - WET PREP FOR TRICH, YEAST, CLUE - Urine Culture  2. Acute non-recurrent maxillary sinusitis Treating with Augmenting BID x 7 days. Adding Diflucan for VVC prophylaxis. Continue conservative measures and OTC symptom management if desired.  - amoxicillin-clavulanate (AUGMENTIN) 875-125 MG tablet; Take 1 tablet by mouth 2 (two) times daily.  Dispense: 14 tablet; Refill: 0 - fluconazole (DIFLUCAN) 150  MG tablet; Take 1 tablet (150 mg total) by mouth once for 1 dose.  Dispense: 1 tablet; Refill: 0   Return if symptoms worsen or fail to improve.  ___________________________________________ Clearnce Sorrel, DNP, APRN, FNP-BC [facility] [place]

## 2022-03-28 LAB — WET PREP FOR TRICH, YEAST, CLUE
MICRO NUMBER:: 14345648
Specimen Quality: ADEQUATE

## 2022-03-30 LAB — URINE CULTURE
MICRO NUMBER:: 14350848
SPECIMEN QUALITY:: ADEQUATE

## 2022-07-12 ENCOUNTER — Encounter: Payer: Self-pay | Admitting: Medical-Surgical

## 2022-09-02 ENCOUNTER — Ambulatory Visit: Payer: 59 | Admitting: Medical-Surgical

## 2022-09-09 ENCOUNTER — Encounter: Payer: Self-pay | Admitting: Medical-Surgical

## 2022-09-09 ENCOUNTER — Ambulatory Visit (INDEPENDENT_AMBULATORY_CARE_PROVIDER_SITE_OTHER): Payer: 59 | Admitting: Medical-Surgical

## 2022-09-09 VITALS — BP 105/70 | HR 90 | Resp 20 | Ht 65.0 in | Wt 149.5 lb

## 2022-09-09 DIAGNOSIS — F17200 Nicotine dependence, unspecified, uncomplicated: Secondary | ICD-10-CM | POA: Diagnosis not present

## 2022-09-09 DIAGNOSIS — R21 Rash and other nonspecific skin eruption: Secondary | ICD-10-CM | POA: Diagnosis not present

## 2022-09-09 DIAGNOSIS — M542 Cervicalgia: Secondary | ICD-10-CM

## 2022-09-09 MED ORDER — PREDNISONE 50 MG PO TABS: 50.0000 mg | ORAL_TABLET | Freq: Every day | ORAL | 0 refills | Status: DC

## 2022-09-09 MED ORDER — CYCLOBENZAPRINE HCL 10 MG PO TABS: 5.0000 mg | ORAL_TABLET | Freq: Three times a day (TID) | ORAL | 1 refills | Status: AC | PRN

## 2022-09-09 MED ORDER — NICOTINE 21 MG/24HR TD PT24: 21.0000 mg | MEDICATED_PATCH | Freq: Every day | TRANSDERMAL | 0 refills | Status: DC

## 2022-09-09 MED ORDER — BUPROPION HCL ER (XL) 150 MG PO TB24: 150.0000 mg | ORAL_TABLET | ORAL | 0 refills | Status: DC

## 2022-09-09 NOTE — Progress Notes (Signed)
        Established patient visit  History, exam, impression, and plan:  1. Skin rash of both lower legs Joanne [name] 52 year old female presenting today with continued complaints of a skin rash on both of her lower extremities.  We have evaluated this in the past here in our office and subsequently referred her to dermatology.  She was seen by the dermatology and skin surgery center in [place].  Reports that they did a biopsy at the time and told her it was dermatitis.  They gave her 2 different creams to use on the rash when it flares however these are not helping and her symptoms continue to worsen.  On evaluation she does have scattered areas of erythema to the bilateral lower extremities.  Due to chronic use of steroid topicals, she does have some skin thinning that is already started.  Unfortunately, dermatology records and testing is not available in our system.  Recommend contacting the dermatologist for an appointment since the last time she saw them was in October 2023.  Patient verbalized understanding is agreeable.  2. Cervicalgia History of chronic neck concerns and degenerative changes.  Has had a fusion from C5-C7.  Most recent imaging in 2022 showed degenerative changes and some bilateral spurring.  Over the last month she has had a worsening of her neck pain.  She originally thought she slept wrong however her symptoms did not improve.  Reports limited range of motion.  On exam, mild limitation in range of motion for bilateral rotation, moderate limitation with extension, severe limitation in lateral flexion bilaterally.  Discussed recommendations for conservative management.  Will do a burst of prednisone 50 mg daily x 5 days.  Adding Flexeril for as needed use.  Discussed referral to physical therapy however she would like to see how the medications do before making this decision. - cyclobenzaprine (FLEXERIL) 10 MG tablet; Take 0.5-1 tablets (5-10 mg total) by mouth 3 (three) times  daily as needed for muscle spasms. Caution: can cause drowsiness  Dispense: 30 tablet; Refill: 1 - predniSONE (DELTASONE) 50 MG tablet; Take 1 tablet (50 mg total) by mouth daily.  Dispense: 5 tablet; Refill: 0  3. Tobacco use disorder Long history of tobacco use disorder.  She is currently at a point where she would like to quit smoking and is interested in things that can help.  Previously used nicotine patches with good results and would like to restart these.  Sending in nicotine 21 mg patches to use daily over the next 4-6 weeks.  Adding Wellbutrin 150 mg daily to assist with cessation. - nicotine (NICODERM CQ - DOSED IN MG/24 HOURS) 21 mg/24hr patch; Place 1 patch (21 mg total) onto the skin daily.  Dispense: 28 patch; Refill: 0 - buPROPion (WELLBUTRIN XL) 150 MG 24 hr tablet; Take 1 tablet (150 mg total) by mouth every morning.  Dispense: 90 tablet; Refill: 0  Procedures performed this visit: None.  Return in about 4 weeks (around 10/07/2022) for neck pain/smoking cessation follow up.  __________________________________ Thayer Ohm, DNP, APRN, FNP-BC Primary Care and Sports Medicine [facility]

## 2022-10-07 ENCOUNTER — Other Ambulatory Visit: Payer: Self-pay | Admitting: Medical-Surgical

## 2022-10-07 ENCOUNTER — Encounter: Payer: Self-pay | Admitting: Medical-Surgical

## 2022-10-07 ENCOUNTER — Ambulatory Visit: Payer: 59 | Admitting: Medical-Surgical

## 2022-10-07 DIAGNOSIS — F17200 Nicotine dependence, unspecified, uncomplicated: Secondary | ICD-10-CM

## 2022-10-16 ENCOUNTER — Ambulatory Visit (INDEPENDENT_AMBULATORY_CARE_PROVIDER_SITE_OTHER): Payer: 59 | Admitting: Medical-Surgical

## 2022-10-16 ENCOUNTER — Encounter: Payer: Self-pay | Admitting: Medical-Surgical

## 2022-10-16 VITALS — BP 124/68 | HR 70 | Temp 97.9°F | Ht 65.0 in | Wt 149.3 lb

## 2022-10-16 DIAGNOSIS — R21 Rash and other nonspecific skin eruption: Secondary | ICD-10-CM | POA: Diagnosis not present

## 2022-10-16 DIAGNOSIS — R600 Localized edema: Secondary | ICD-10-CM | POA: Diagnosis not present

## 2022-10-16 DIAGNOSIS — R5383 Other fatigue: Secondary | ICD-10-CM | POA: Diagnosis not present

## 2022-10-16 DIAGNOSIS — E038 Other specified hypothyroidism: Secondary | ICD-10-CM

## 2022-10-16 MED ORDER — EUCRISA 2 % EX OINT: TOPICAL_OINTMENT | CUTANEOUS | 1 refills | Status: DC

## 2022-10-16 NOTE — Progress Notes (Signed)
        Established patient visit  History, exam, impression, and plan:  1. Skin rash Pleasant 52 year old female presenting with complaints that her previous lower extremity rash persists despite treatments with steroid creams and prednisone. Was evaluated by dermatology who completed a biopsy with the diagnosis of dermatitis. Unable to pinpoint the cause. Continues to develop new spots. At our last appointment, recommended she reach back out to dermatology but has not done so yet. Spoke with a friend and Joanne [name] was mentioned. She would like to try this. Start Eucrisa topically to the affected area once daily. Reach out to dermatology for repeat evaluation.   2. Bilateral lower extremity edema Has been having BLE edema recently despite no changes in her diet/activity. Works for [employer] and spends time in the heat and on her feet. This has never bothered her before. Over the past month, the swelling has been present most days. Discussed venous insufficiency, malnutrition, sodium excess, etc. Unclear cause at this time. Checking labs as below. Reviewed conservative measures for management of edema.  - CBC with Differential/Platelet - COMPLETE METABOLIC PANEL WITH GFR - TSH - Hemoglobin A1c - Iron, TIBC and Ferritin Panel  3. Subclinical hypothyroidism Checking TSH today.  - TSH  4. Fatigue, unspecified type Continues to complain of significant fatigue. Consider iron deficiency, thyroid dysfunction, poor nutrition, medication side effect, insomnia, depression, etc. Checking labs to rule out metabolic cause. 0... - CBC with Differential/Platelet - COMPLETE METABOLIC PANEL WITH GFR - TSH - Iron, TIBC and Ferritin Panel   Procedures performed this visit: None.  Return in about 6 weeks (around 11/27/2022) for rash/smoking follow up.  __________________________________ Thayer Ohm, DNP, APRN, FNP-BC Primary Care and Sports Medicine [facility]       Leg  rash- no improvement with prednisone, cream not helping, getting worse, derm said dermatitis, biopsy done  Leg swelling in the evenings- last 2 weeks, no unusual SOB, some CP, voids once daily, drinks plenty of fluids, pees a lot at night.   Ear dry/crusty  Neck pain- still comes and goes, mostly better, doing exercises at home  Smoking cessation- did not start Chantix, plans to start this month

## 2022-10-17 ENCOUNTER — Encounter: Payer: Self-pay | Admitting: Medical-Surgical

## 2022-11-14 ENCOUNTER — Encounter: Payer: Self-pay | Admitting: Medical-Surgical

## 2022-11-14 ENCOUNTER — Other Ambulatory Visit: Payer: Self-pay

## 2022-11-19 ENCOUNTER — Encounter: Payer: Self-pay | Admitting: Medical-Surgical

## 2022-11-27 ENCOUNTER — Encounter: Payer: Self-pay | Admitting: Medical-Surgical

## 2022-11-27 ENCOUNTER — Ambulatory Visit (INDEPENDENT_AMBULATORY_CARE_PROVIDER_SITE_OTHER): Payer: 59 | Admitting: Medical-Surgical

## 2022-11-27 VITALS — BP 112/76 | HR 80 | Ht 65.0 in | Wt 149.0 lb

## 2022-11-27 DIAGNOSIS — Z1231 Encounter for screening mammogram for malignant neoplasm of breast: Secondary | ICD-10-CM | POA: Diagnosis not present

## 2022-11-27 DIAGNOSIS — Z720 Tobacco use: Secondary | ICD-10-CM | POA: Diagnosis not present

## 2022-11-27 DIAGNOSIS — M2550 Pain in unspecified joint: Secondary | ICD-10-CM | POA: Diagnosis not present

## 2022-11-27 DIAGNOSIS — R21 Rash and other nonspecific skin eruption: Secondary | ICD-10-CM | POA: Diagnosis not present

## 2022-11-27 MED ORDER — CLINDAMYCIN HCL 300 MG PO CAPS: 300.0000 mg | ORAL_CAPSULE | Freq: Four times a day (QID) | ORAL | 0 refills | Status: AC

## 2022-11-27 MED ORDER — FLUCONAZOLE 150 MG PO TABS: 150.0000 mg | ORAL_TABLET | Freq: Once | ORAL | 0 refills | Status: AC

## 2022-11-27 NOTE — Progress Notes (Signed)
        Established patient visit  History, exam, impression, and plan:  1. Polyarthralgia Joanne [name] 52 year old female presenting today with reports of continued issues with polyarthralgia.  Notes that she has multiple sites that cause significant discomfort.  This affects her bilateral wrists and knees as well as her back and hips.  She had a workup done a little over a year ago which showed an elevated ANA of 1: 320 but negative rheumatoid factor and anti-CCP.  Notes that her polyarthralgia is worsening and would like to have these labs rechecked.  Ordering as below. - LUPUS(12) PANEL - C-reactive protein - Rheumatoid Factor - Sed Rate (ESR) - Lupus (SLE) Analysis  2. Encounter for screening mammogram for malignant neoplasm of breast Last mammogram in 2021.  Ordering today. - MM DIGITAL SCREENING BILATERAL; Future  3. Tobacco use At her previous appointment, we discussed tobacco cessation options.  She was prescribed Wellbutrin 150 mg daily along with nicotine patches.  Today, reports that she has these medications however has not started them.  She has been working on several different things and is trying to finish up obtaining a life Brewing technologist.  Wants to start these medications in a couple of weeks when she will be able to fully concentrate on it.  She does have a 30 pack year smoking history and is due for lung cancer screening.  Ordering CT chest for lung cancer screening.  Recommend starting smoking cessation medications as soon as possible. - CT CHEST LUNG CA SCREEN LOW DOSE W/O CM; Future  4. Skin rash Continues to have difficulty with the skin rash on her legs.  She has tried topical and oral steroids as well as Neosporin without any benefit.  She was seen at dermatology who did a biopsy.  The results came back as "dermatitis".  At her last appointment, I recommended that she contact dermatology for further instructions however she has not been able to do this yet.  Reports  that she has been using clobetasol twice daily for several weeks and the rash will look better for a couple of days however it then recurs or flares.  Only itches when she gets hot.  On further evaluation today, the lesions glow reddish-purple under the [employer].  As steroids and antibiotic topicals have not been helpful, concern for erythrasma.  Plan to treat with oral clindamycin to evaluate for resolution.  Recommend reaching out to dermatology for further recommendations if this is not helpful.  Adding Diflucan for VVC prophylaxis.   Procedures performed this visit: None.  Return if symptoms worsen or fail to improve.  __________________________________ Thayer Ohm, DNP, APRN, FNP-BC Primary Care and Sports Medicine [facility]

## 2022-12-04 ENCOUNTER — Ambulatory Visit (INDEPENDENT_AMBULATORY_CARE_PROVIDER_SITE_OTHER): Payer: 59

## 2022-12-04 DIAGNOSIS — Z720 Tobacco use: Secondary | ICD-10-CM

## 2022-12-04 DIAGNOSIS — F17210 Nicotine dependence, cigarettes, uncomplicated: Secondary | ICD-10-CM

## 2022-12-06 LAB — LUPUS (SLE) ANALYSIS
Anti Nuclear Antibody (ANA): NEGATIVE
Anti-striation Abs: NEGATIVE
Complement C4, Serum: 20 mg/dL (ref 12–38)
ENA RNP Ab: <0.2 AI (ref 0.0–0.9)
ENA SM Ab Ser-aCnc: <0.2 AI (ref 0.0–0.9)
ENA SSA (RO) Ab: <0.2 AI (ref 0.0–0.9)
ENA SSB (LA) Ab: <0.2 AI (ref 0.0–0.9)
Mitochondrial Ab: <20 U (ref 0.0–20.0)
Parietal Cell Ab: 17 U (ref 0.0–20.0)
Scleroderma (Scl-70) (ENA) Antibody, IgG: <0.2 AI (ref 0.0–0.9)
Smooth Muscle Ab: 6 U (ref 0–19)
Thyroperoxidase Ab SerPl-aCnc: 169 [IU]/mL — ABNORMAL HIGH (ref 0–34)
dsDNA Ab: 1 [IU]/mL (ref 0–9)

## 2022-12-06 LAB — RHEUMATOID FACTOR: Rheumatoid fact SerPl-aCnc: 10.4 [IU]/mL (ref <14.0)

## 2022-12-06 LAB — C-REACTIVE PROTEIN: CRP: <1 mg/L (ref 0–10)

## 2022-12-06 LAB — SEDIMENTATION RATE: Sed Rate: 6 mm/h (ref 0–40)

## 2022-12-08 ENCOUNTER — Other Ambulatory Visit: Payer: Self-pay | Admitting: Medical-Surgical

## 2022-12-08 DIAGNOSIS — F17200 Nicotine dependence, unspecified, uncomplicated: Secondary | ICD-10-CM

## 2022-12-15 ENCOUNTER — Encounter: Payer: Self-pay | Admitting: Medical-Surgical

## 2022-12-15 DIAGNOSIS — J439 Emphysema, unspecified: Secondary | ICD-10-CM | POA: Insufficient documentation

## 2023-01-01 ENCOUNTER — Ambulatory Visit: Payer: 59

## 2023-01-07 ENCOUNTER — Ambulatory Visit: Payer: 59

## 2023-01-11 ENCOUNTER — Encounter: Payer: Self-pay | Admitting: Medical-Surgical

## 2023-01-11 DIAGNOSIS — R21 Rash and other nonspecific skin eruption: Secondary | ICD-10-CM

## 2023-01-12 ENCOUNTER — Telehealth: Payer: Self-pay | Admitting: Medical-Surgical

## 2023-01-12 NOTE — Telephone Encounter (Signed)
[facility] called in stating that they can not take this patient due to her having Medicaid.

## 2023-01-13 NOTE — Telephone Encounter (Signed)
Is there another location, you would like referral sent to? Please see previous note below.

## 2023-01-13 NOTE — Telephone Encounter (Signed)
Returned [facility] call back ( Spoke with Rivka Barbara)- per Rivka Barbara patient referral has been declined due to patient having [place] Medicaid Family Planning. Explained to [facility] that patients [place] Medicaid Family planning is usually not filed on Dermatology visits.  Rivka Barbara states that its part of their office policy not to accept referral when patient has any [place] Medicaid attached.

## 2023-01-20 NOTE — Telephone Encounter (Signed)
Referral, clinical notes, demographics and copies of insurance cards have been faxed to [facility] - at [phone]. Office will contact patient to schedule referral appointment.

## 2023-02-10 ENCOUNTER — Other Ambulatory Visit: Payer: Self-pay | Admitting: Medical-Surgical

## 2023-02-10 DIAGNOSIS — Z1231 Encounter for screening mammogram for malignant neoplasm of breast: Secondary | ICD-10-CM

## 2023-02-18 ENCOUNTER — Ambulatory Visit (INDEPENDENT_AMBULATORY_CARE_PROVIDER_SITE_OTHER): Payer: 59

## 2023-02-18 DIAGNOSIS — Z1231 Encounter for screening mammogram for malignant neoplasm of breast: Secondary | ICD-10-CM | POA: Diagnosis not present

## 2023-08-05 ENCOUNTER — Ambulatory Visit: Admitting: Sports Medicine

## 2023-08-11 ENCOUNTER — Ambulatory Visit: Admitting: Sports Medicine

## 2023-08-17 ENCOUNTER — Ambulatory Visit (INDEPENDENT_AMBULATORY_CARE_PROVIDER_SITE_OTHER): Admitting: Sports Medicine

## 2023-08-17 ENCOUNTER — Ambulatory Visit

## 2023-08-17 DIAGNOSIS — M542 Cervicalgia: Secondary | ICD-10-CM

## 2023-08-17 DIAGNOSIS — M5030 Other cervical disc degeneration, unspecified cervical region: Secondary | ICD-10-CM

## 2023-08-17 MED ORDER — PREGABALIN 25 MG PO CAPS: ORAL_CAPSULE | ORAL | 11 refills | Status: DC

## 2023-08-17 NOTE — Assessment & Plan Note (Signed)
 Joanne [name] is a very pleasant 53 year old female, she has a C5-C7 fusion done with Dr. Jackee Marus back in 2014, initially did well. 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 I last saw her in 2022, she was having some neck pain with paracervical radiation, we added Lyrica  due to excessive sedation with gabapentin . We started the up titration and landed on 50 mg 2-3 times daily however she was lost to follow-up. We will restart the titration at 25 mg, I would like updated x-rays, home physical therapy, return in 4 to 6 weeks, we will continue the titration of Lyrica  until we find a dose that works or she gets sedated.

## 2023-08-17 NOTE — Progress Notes (Signed)
    Procedures performed today:    None.  Independent interpretation of notes and tests performed by another provider:   None.  Brief History, Exam, Impression, and Recommendations:    Degenerative disc disease, cervical Joanne [name] is a very pleasant 53 year old female, she has a C5-C7 fusion done with Dr. Jackee Marus back in 2014, initially did well. 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 I last saw her in 2022, she was having some neck pain with paracervical radiation, we added Lyrica  due to excessive sedation with gabapentin . We started the up titration and landed on 50 mg 2-3 times daily however she was lost to follow-up. We will restart the titration at 25 mg, I would like updated x-rays, home physical therapy, return in 4 to 6 weeks, we will continue the titration of Lyrica  until we find a dose that works or she gets sedated.    ____________________________________________ Joanne [name]. Joanne [name], M.D., ABFM., CAQSM., AME. Primary Care and Sports Medicine [facility]  Adjunct Professor of [facility] Medicine  [employer]  Restaurant manager, fast food

## 2023-08-18 ENCOUNTER — Ambulatory Visit: Payer: Self-pay | Admitting: Sports Medicine

## 2023-09-28 ENCOUNTER — Ambulatory Visit (INDEPENDENT_AMBULATORY_CARE_PROVIDER_SITE_OTHER): Admitting: Sports Medicine

## 2023-09-28 ENCOUNTER — Encounter: Payer: Self-pay | Admitting: Sports Medicine

## 2023-09-28 DIAGNOSIS — L301 Dyshidrosis [pompholyx]: Secondary | ICD-10-CM | POA: Diagnosis not present

## 2023-09-28 DIAGNOSIS — M5030 Other cervical disc degeneration, unspecified cervical region: Secondary | ICD-10-CM | POA: Diagnosis not present

## 2023-09-28 MED ORDER — PREGABALIN 50 MG PO CAPS: 50.0000 mg | ORAL_CAPSULE | Freq: Every day | ORAL | 3 refills | Status: AC

## 2023-09-28 MED ORDER — CLOBETASOL PROPIONATE 0.05 % EX CREA: 1.0000 | TOPICAL_CREAM | Freq: Two times a day (BID) | CUTANEOUS | 2 refills | Status: DC

## 2023-09-28 NOTE — Progress Notes (Signed)
    Procedures performed today:    None.  Independent interpretation of notes and tests performed by another provider:   None.  Brief History, Exam, Impression, and Recommendations:    Degenerative disc disease, cervical 53 year old female history of C5-C7 fusion with Dr. Beuford back in 2014, initially did well, then started having increasing pain, she discontinued her Lyrica , had worsening pain, we restarted it, she is landed on 50 mg once daily, we can refill this, return to see me as needed.  Dyshidrotic eczema Itchy hands lateral aspect of the fingers, thumbs, adding topical clobetasol to be used twice a day, follow this up with PCP.  I spent 30 minutes of total time managing this patient today, this includes chart review, face to face, and non-face to face time.  ____________________________________________ Debby PARAS. Curtis, M.D., ABFM., CAQSM., AME. Primary Care and Sports Medicine [facility]  Adjunct Professor of [facility] Medicine  [employer]  Restaurant manager, fast food

## 2023-09-28 NOTE — Assessment & Plan Note (Signed)
 53 year old female history of C5-C7 fusion with Dr. Beuford back in 2014, initially did well, then started having increasing pain, she discontinued her Lyrica , had worsening pain, we restarted it, she is landed on 50 mg once daily, we can refill this, return to see me as needed.

## 2023-09-28 NOTE — Assessment & Plan Note (Signed)
 Itchy hands lateral aspect of the fingers, thumbs, adding topical clobetasol to be used twice a day, follow this up with PCP.

## 2023-10-28 ENCOUNTER — Encounter: Payer: Self-pay | Admitting: Medical-Surgical

## 2023-10-28 ENCOUNTER — Ambulatory Visit (INDEPENDENT_AMBULATORY_CARE_PROVIDER_SITE_OTHER): Payer: PRIVATE HEALTH INSURANCE | Admitting: Medical-Surgical

## 2023-10-28 VITALS — BP 107/74 | HR 75 | Resp 20 | Ht 65.0 in | Wt 166.0 lb

## 2023-10-28 DIAGNOSIS — R635 Abnormal weight gain: Secondary | ICD-10-CM | POA: Diagnosis not present

## 2023-10-28 DIAGNOSIS — R791 Abnormal coagulation profile: Secondary | ICD-10-CM

## 2023-10-28 DIAGNOSIS — F422 Mixed obsessional thoughts and acts: Secondary | ICD-10-CM | POA: Diagnosis not present

## 2023-10-28 DIAGNOSIS — H66002 Acute suppurative otitis media without spontaneous rupture of ear drum, left ear: Secondary | ICD-10-CM

## 2023-10-28 MED ORDER — AMOXICILLIN-POT CLAVULANATE 875-125 MG PO TABS: 1.0000 | ORAL_TABLET | Freq: Two times a day (BID) | ORAL | 0 refills | Status: DC

## 2023-10-28 MED ORDER — ESCITALOPRAM OXALATE 20 MG PO TABS: ORAL_TABLET | ORAL | 0 refills | Status: DC

## 2023-10-28 MED ORDER — TOPIRAMATE 25 MG PO CPSP: 25.0000 mg | ORAL_CAPSULE | Freq: Every day | ORAL | 0 refills | Status: DC

## 2023-10-28 MED ORDER — PHENTERMINE HCL 37.5 MG PO TABS: ORAL_TABLET | ORAL | 0 refills | Status: DC

## 2023-10-28 NOTE — Progress Notes (Signed)
 Established patient visit  Discussed the use of AI scribe software for clinical note transcription with the patient, who gave verbal consent to proceed.  History of Present Illness   Joanne [name] is a 53 year old female who presents with persistent ear pain and hearing loss.  Right otalgia and hearing loss - Persistent right ear pain for two weeks, initially severe enough to prevent lying on her pillow without significant discomfort - Pain has improved slightly, now tolerates touching the ear but still experiences pain with pressure - Hearing loss in the right ear, initially almost complete, with slight improvement since onset - Onset of symptoms followed a loud jaw pop while yawning at work, with pain intensifying overnight - Two weeks ago, eardrum appeared erythematous but intact at urgent care evaluation - Treated with amoxicillin  500 mg and ear drops  History of otologic disease - History of ear infections as a child, requiring ear flushing due to non-draining ears - Only two lifetime ear infections prior to current episode   OCD - Diagnosed formally with OCD - No previous medications and has been very hesitant to start anything - Biggest trigger is finding her home dirty - Responds with increased anger, agitation, and violent acts - Ready to start a medication to see if it will help calm symptoms   Weight gain - Very concerned as she keeps gaining weight - Trying to follow a healthy diet - Very physically active at work but no outside exercise - Wants to try a medication to help   Prolonged bleeding - Donates plasma once weekly - Once the needle comes out, she reports bleeding for a long time - Typically, the same vein in the right [facility] is used for each donation      Physical Exam Vitals reviewed.  Constitutional:      General: She is not in acute distress.    Appearance: Normal appearance. She is not ill-appearing.  HENT:     Head: Normocephalic and  atraumatic.     Right Ear: Tympanic membrane, ear canal and external ear normal. There is no impacted cerumen.     Left Ear: A middle ear effusion is present. There is no impacted cerumen.     Ears:     Comments: Erythema of the left ear canal with cloudy, dull TM.  Cardiovascular:     Rate and Rhythm: Normal rate and regular rhythm.     Pulses: Normal pulses.     Heart sounds: Normal heart sounds. No murmur heard.    No friction rub. No gallop.  Pulmonary:     Effort: Pulmonary effort is normal. No respiratory distress.     Breath sounds: Normal breath sounds. No wheezing.  Skin:    General: Skin is warm and dry.  Neurological:     Mental Status: She is alert and oriented to person, place, and time.  Psychiatric:        Mood and Affect: Mood normal.        Behavior: Behavior normal.        Thought Content: Thought content normal.        Judgment: Judgment normal.     Assessment and Plan    Otitis Media Persistent right ear pain and hearing loss. Current antibiotics insufficient. Augmentin  preferred for broader coverage. - Switch to Augmentin  for better coverage. - Continue ear drops.  Obsessive-Compulsive Disorder (OCD) Severe OCD symptoms with violent tendencies. Wellbutrin  and Prozac ineffective. Lexapro  chosen  for potential efficacy in higher doses. - Prescribe Lexapro  20 mg tablet, take half tablet (10 mg) daily for two weeks, then increase to full tablet (20 mg) daily. - Advise obtaining a pill splitter for accurate dosing. - Schedule follow-up in 4-6 weeks to assess efficacy and tolerance.  Weight Gain Significant weight gain despite low intake. Discussed protein intake and strength training. Phentermine  and Topamax  chosen for weight management. - Prescribe phentermine  half tablet daily and Topamax  one capsule daily for weight management. - Advise taking medications first thing in the morning. - Encourage increased protein intake through plant-based protein shakes and  bars. - Recommend incorporating strength training exercises into daily routine.  Plasma Donation Complications Post-donation bleeding likely due to scar tissue and reduced clotting factors. Discussed Vaseline and massage for scar tissue management. - Check blood counts, PT/INR, and PTT to assess clotting function. - Advise use of Vaseline and massage on scar tissue after clotting is achieved.      Return in about 4 weeks (around 11/25/2023) for weight check/mood follow up.  __________________________________ Zada FREDRIK Palin, DNP, APRN, FNP-BC Primary Care and Sports Medicine [facility] [place]

## 2023-10-29 ENCOUNTER — Ambulatory Visit: Payer: Self-pay | Admitting: Medical-Surgical

## 2023-10-29 LAB — PROTIME-INR
INR: 0.9 (ref 0.9–1.2)
Prothrombin Time: 10 s (ref 9.1–12.0)

## 2023-10-29 LAB — CBC WITH DIFFERENTIAL/PLATELET
Basophils Absolute: 0 x10E3/uL (ref 0.0–0.2)
Basos: 1 %
EOS (ABSOLUTE): 0.2 x10E3/uL (ref 0.0–0.4)
Eos: 3 %
Hematocrit: 43 % (ref 34.0–46.6)
Hemoglobin: 14.3 g/dL (ref 11.1–15.9)
Immature Grans (Abs): 0 x10E3/uL (ref 0.0–0.1)
Immature Granulocytes: 0 %
Lymphocytes Absolute: 1.6 x10E3/uL (ref 0.7–3.1)
Lymphs: 27 %
MCH: 32.4 pg (ref 26.6–33.0)
MCHC: 33.3 g/dL (ref 31.5–35.7)
MCV: 98 fL — ABNORMAL HIGH (ref 79–97)
Monocytes Absolute: 0.4 x10E3/uL (ref 0.1–0.9)
Monocytes: 7 %
Neutrophils Absolute: 3.8 x10E3/uL (ref 1.4–7.0)
Neutrophils: 62 %
Platelets: 186 x10E3/uL (ref 150–450)
RBC: 4.41 x10E6/uL (ref 3.77–5.28)
RDW: 11.9 % (ref 11.7–15.4)
WBC: 6 x10E3/uL (ref 3.4–10.8)

## 2023-10-30 ENCOUNTER — Telehealth: Payer: Self-pay

## 2023-10-30 ENCOUNTER — Encounter: Payer: Self-pay | Admitting: Medical-Surgical

## 2023-10-30 ENCOUNTER — Other Ambulatory Visit (HOSPITAL_COMMUNITY): Payer: Self-pay

## 2023-10-30 NOTE — Telephone Encounter (Signed)
 Pharmacy Patient Advocate Encounter   Received notification from CoverMyMeds that prior authorization for Phentermine  HCl 37.5MG  tablets is required/requested.   Insurance verification completed.   The patient is insured through [facility] .   Per test claim: PA required and submitted KEY/EOC/Request #: [id number] from 10/30/23 to 01/28/24. Ran test claim, Copay is $0.10. This test claim was processed through [facility]- copay amounts may vary at other pharmacies due to pharmacy/plan contracts, or as the patient moves through the different stages of their insurance plan.    PA Case ID #: [id number]

## 2023-11-02 ENCOUNTER — Ambulatory Visit: Admitting: Medical-Surgical

## 2023-11-22 ENCOUNTER — Other Ambulatory Visit: Payer: Self-pay | Admitting: Medical Genetics

## 2023-11-24 NOTE — Progress Notes (Unsigned)
        Established patient visit   History of Present Illness   Discussed the use of AI scribe software for clinical note transcription with the patient, who gave verbal consent to proceed.  History of Present Illness            Physical Exam   Physical Exam  Assessment & Plan   Assessment and Plan               Follow up   No follow-ups on file.  __________________________________ Joanne FREDRIK Palin, DNP, APRN, FNP-BC Primary Care and Sports Medicine [facility] [place]

## 2023-11-25 ENCOUNTER — Encounter: Payer: Self-pay | Admitting: Medical-Surgical

## 2023-11-25 ENCOUNTER — Ambulatory Visit (INDEPENDENT_AMBULATORY_CARE_PROVIDER_SITE_OTHER): Admitting: Medical-Surgical

## 2023-11-25 VITALS — BP 108/73 | HR 89 | Resp 20 | Ht 65.0 in | Wt 163.7 lb

## 2023-11-25 DIAGNOSIS — F418 Other specified anxiety disorders: Secondary | ICD-10-CM | POA: Diagnosis not present

## 2023-11-25 DIAGNOSIS — R635 Abnormal weight gain: Secondary | ICD-10-CM

## 2023-11-25 DIAGNOSIS — L0291 Cutaneous abscess, unspecified: Secondary | ICD-10-CM

## 2023-11-25 MED ORDER — PHENTERMINE HCL 37.5 MG PO TABS
ORAL_TABLET | ORAL | 2 refills | Status: AC
Start: 2023-11-25 — End: ?

## 2023-11-25 MED ORDER — DOXYCYCLINE HYCLATE 100 MG PO TABS: 100.0000 mg | ORAL_TABLET | Freq: Two times a day (BID) | ORAL | 0 refills | Status: AC

## 2023-11-25 MED ORDER — TOPIRAMATE 50 MG PO CPSP: 50.0000 mg | ORAL_CAPSULE | Freq: Every day | ORAL | 1 refills | Status: AC

## 2023-11-25 MED ORDER — VILAZODONE HCL 40 MG PO TABS: ORAL_TABLET | ORAL | 2 refills | Status: DC

## 2023-11-25 MED ORDER — FLUCONAZOLE 150 MG PO TABS: 150.0000 mg | ORAL_TABLET | Freq: Once | ORAL | 0 refills | Status: AC

## 2023-11-26 ENCOUNTER — Ambulatory Visit

## 2023-11-26 DIAGNOSIS — L0291 Cutaneous abscess, unspecified: Secondary | ICD-10-CM

## 2023-11-26 DIAGNOSIS — J320 Chronic maxillary sinusitis: Secondary | ICD-10-CM

## 2023-12-02 ENCOUNTER — Telehealth: Payer: Self-pay

## 2023-12-02 NOTE — Telephone Encounter (Signed)
 Teams msg sent to [facility] to expedite CT results. A MyChart message has been sent to the patient of the current update.

## 2023-12-02 NOTE — Telephone Encounter (Signed)
 Copied from CRM [id number]. Topic: Clinical - Medical Advice >> Dec 02, 2023  9:26 AM Zane F wrote: Reason for CRM:   Patient is calling in to inquire about a CT Maxillofacial WO CM that she had performed on 11/26/2023; Patient was informed there is typically a two week wait for results on imaging; The results should populate on MyChart when they have been reviewed by a radiologist.   Please call the patient back if results come in and can be reviewed when this message is received.   Callback Number: [id number]

## 2023-12-03 ENCOUNTER — Ambulatory Visit: Payer: Self-pay | Admitting: Medical-Surgical

## 2023-12-03 DIAGNOSIS — K1370 Unspecified lesions of oral mucosa: Secondary | ICD-10-CM

## 2023-12-08 ENCOUNTER — Encounter: Payer: Self-pay | Admitting: Sports Medicine

## 2023-12-13 MED ORDER — DOXYCYCLINE HYCLATE 100 MG PO TABS
100.0000 mg | ORAL_TABLET | Freq: Two times a day (BID) | ORAL | 0 refills | Status: AC
Start: 2023-12-13 — End: 2023-12-20

## 2023-12-13 NOTE — Addendum Note (Signed)
 Addended byBETHA WILLO MINI on: 12/13/2023 03:22 PM   Modules accepted: Orders

## 2023-12-16 ENCOUNTER — Other Ambulatory Visit: Payer: Self-pay | Admitting: Medical-Surgical

## 2023-12-21 ENCOUNTER — Other Ambulatory Visit: Payer: Self-pay | Admitting: Medical-Surgical

## 2023-12-21 MED ORDER — FLUCONAZOLE 150 MG PO TABS
150.0000 mg | ORAL_TABLET | Freq: Once | ORAL | 0 refills | Status: AC
Start: 2023-12-21 — End: 2023-12-21

## 2023-12-21 NOTE — Addendum Note (Signed)
 Addended byBETHA WILLO MINI on: 12/21/2023 07:55 AM   Modules accepted: Orders

## 2023-12-30 ENCOUNTER — Other Ambulatory Visit (HOSPITAL_COMMUNITY): Payer: Self-pay

## 2024-01-06 ENCOUNTER — Ambulatory Visit: Admitting: Medical-Surgical

## 2024-01-13 ENCOUNTER — Institutional Professional Consult (permissible substitution) (INDEPENDENT_AMBULATORY_CARE_PROVIDER_SITE_OTHER)

## 2024-02-08 ENCOUNTER — Other Ambulatory Visit: Payer: Self-pay | Admitting: Medical Genetics

## 2024-02-08 DIAGNOSIS — Z006 Encounter for examination for normal comparison and control in clinical research program: Secondary | ICD-10-CM

## 2024-04-11 ENCOUNTER — Other Ambulatory Visit: Payer: Self-pay | Admitting: Medical-Surgical

## 2024-05-06 ENCOUNTER — Other Ambulatory Visit: Payer: Self-pay | Admitting: Medical-Surgical
